# Patient Record
Sex: Male | Born: 1975 | Race: Black or African American | Hispanic: No | Marital: Single | Smoking: Former smoker
Health system: Southern US, Community
[De-identification: ages and names within clinical notes are randomized; demographics above are authoritative.]

## PROBLEM LIST (undated history)

## (undated) DIAGNOSIS — I10 Essential (primary) hypertension: Secondary | ICD-10-CM

## (undated) DIAGNOSIS — I509 Heart failure, unspecified: Secondary | ICD-10-CM

## (undated) HISTORY — DX: Essential (primary) hypertension: I10

## (undated) HISTORY — PX: NO PAST SURGERIES: SHX2092

---

## 2005-01-07 ENCOUNTER — Emergency Department: Payer: Self-pay | Admitting: Unknown Physician Specialty

## 2005-01-07 ENCOUNTER — Emergency Department: Payer: Self-pay | Admitting: Emergency Medicine
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
## 2005-10-02 ENCOUNTER — Emergency Department (HOSPITAL_COMMUNITY): Admission: EM | Admit: 2005-10-02 | Discharge: 2005-10-03 | Payer: Self-pay | Admitting: Emergency Medicine

## 2005-10-02 IMAGING — CR DG CHEST 2V
2 series · 2 of 2 positions shown · non-contrast
Comparison: None.

CLINICAL DATA: Cough, chest pain.
 CHEST - 2 VIEW:
 PA and lateral chest radiograph - [DATE].

[w chest pa]
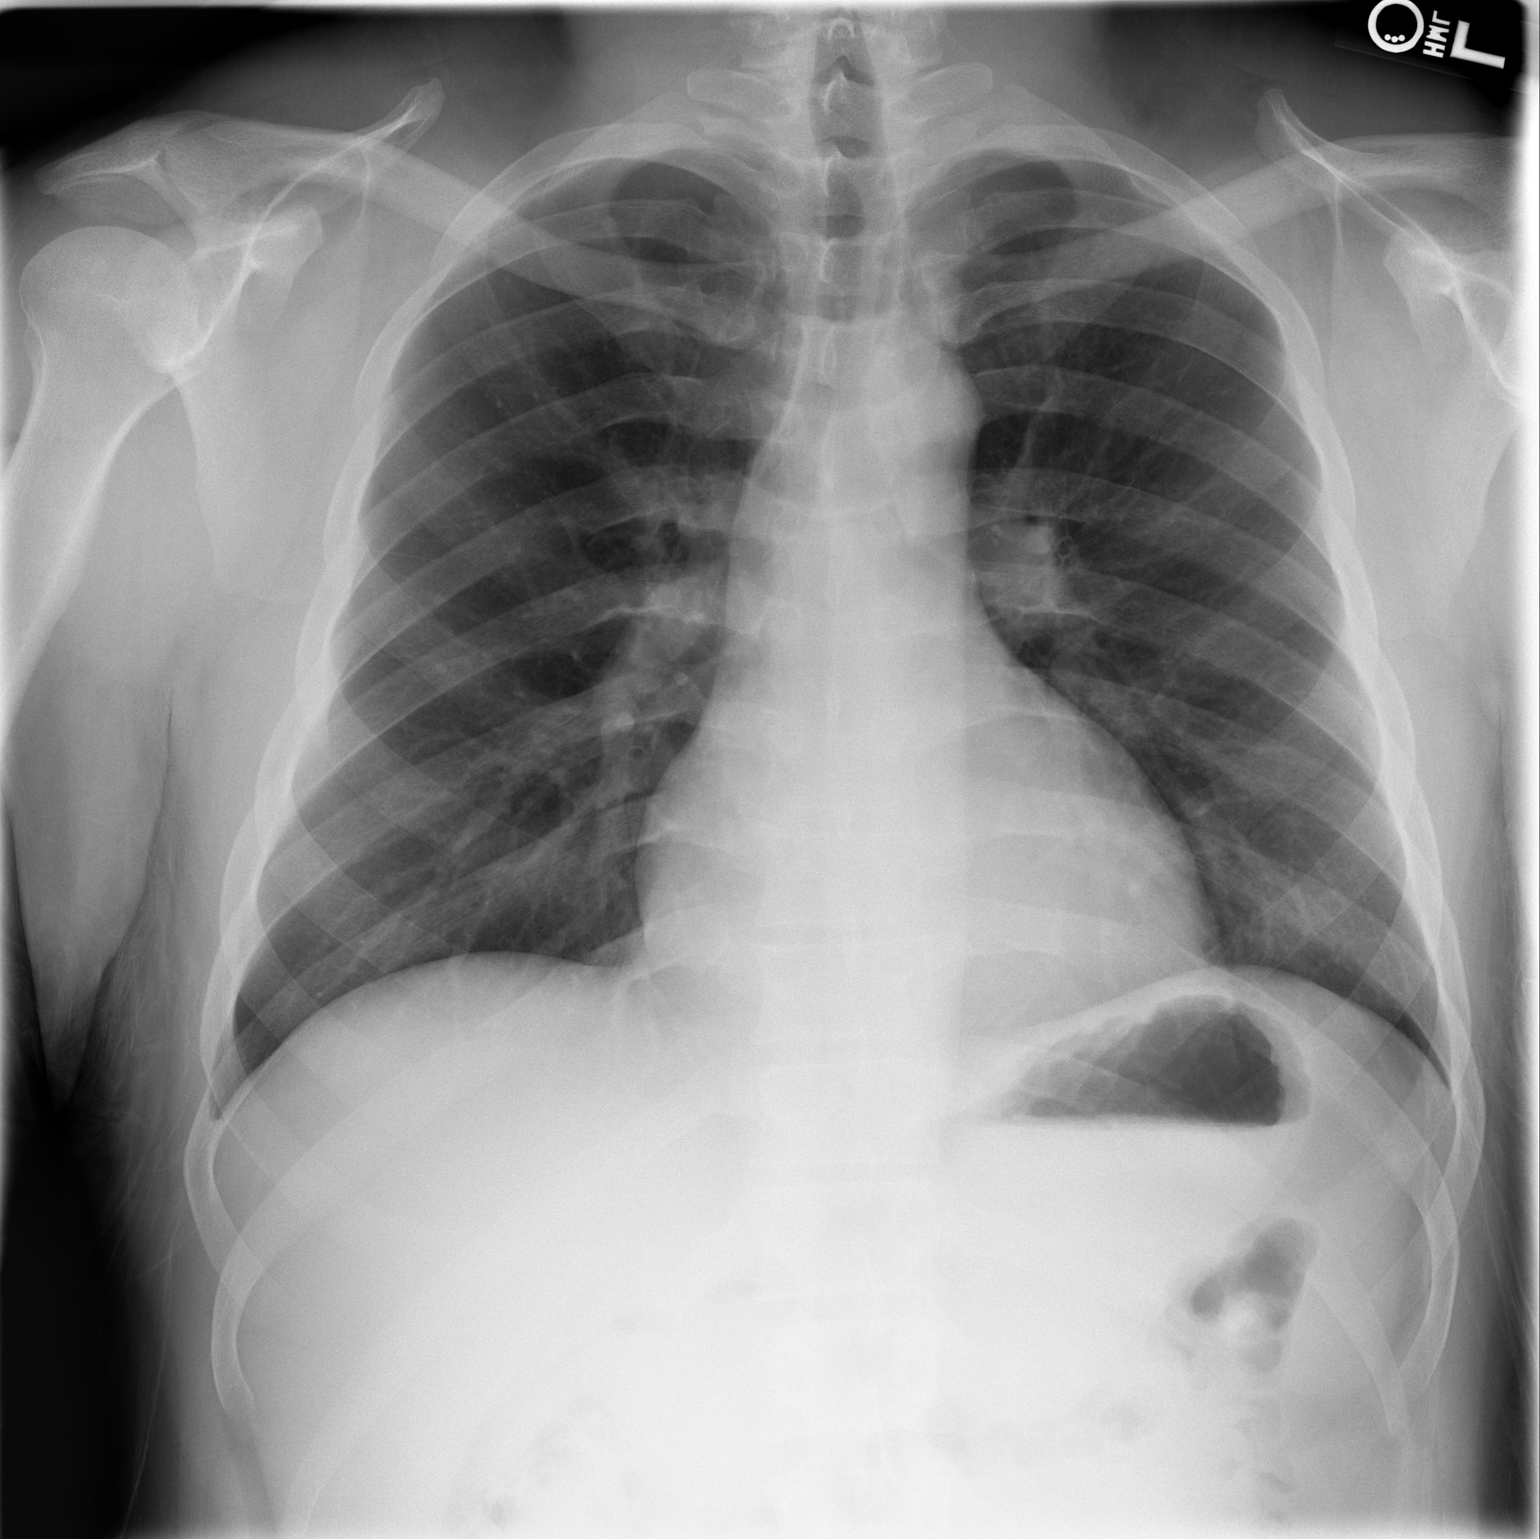

[w chest lat]
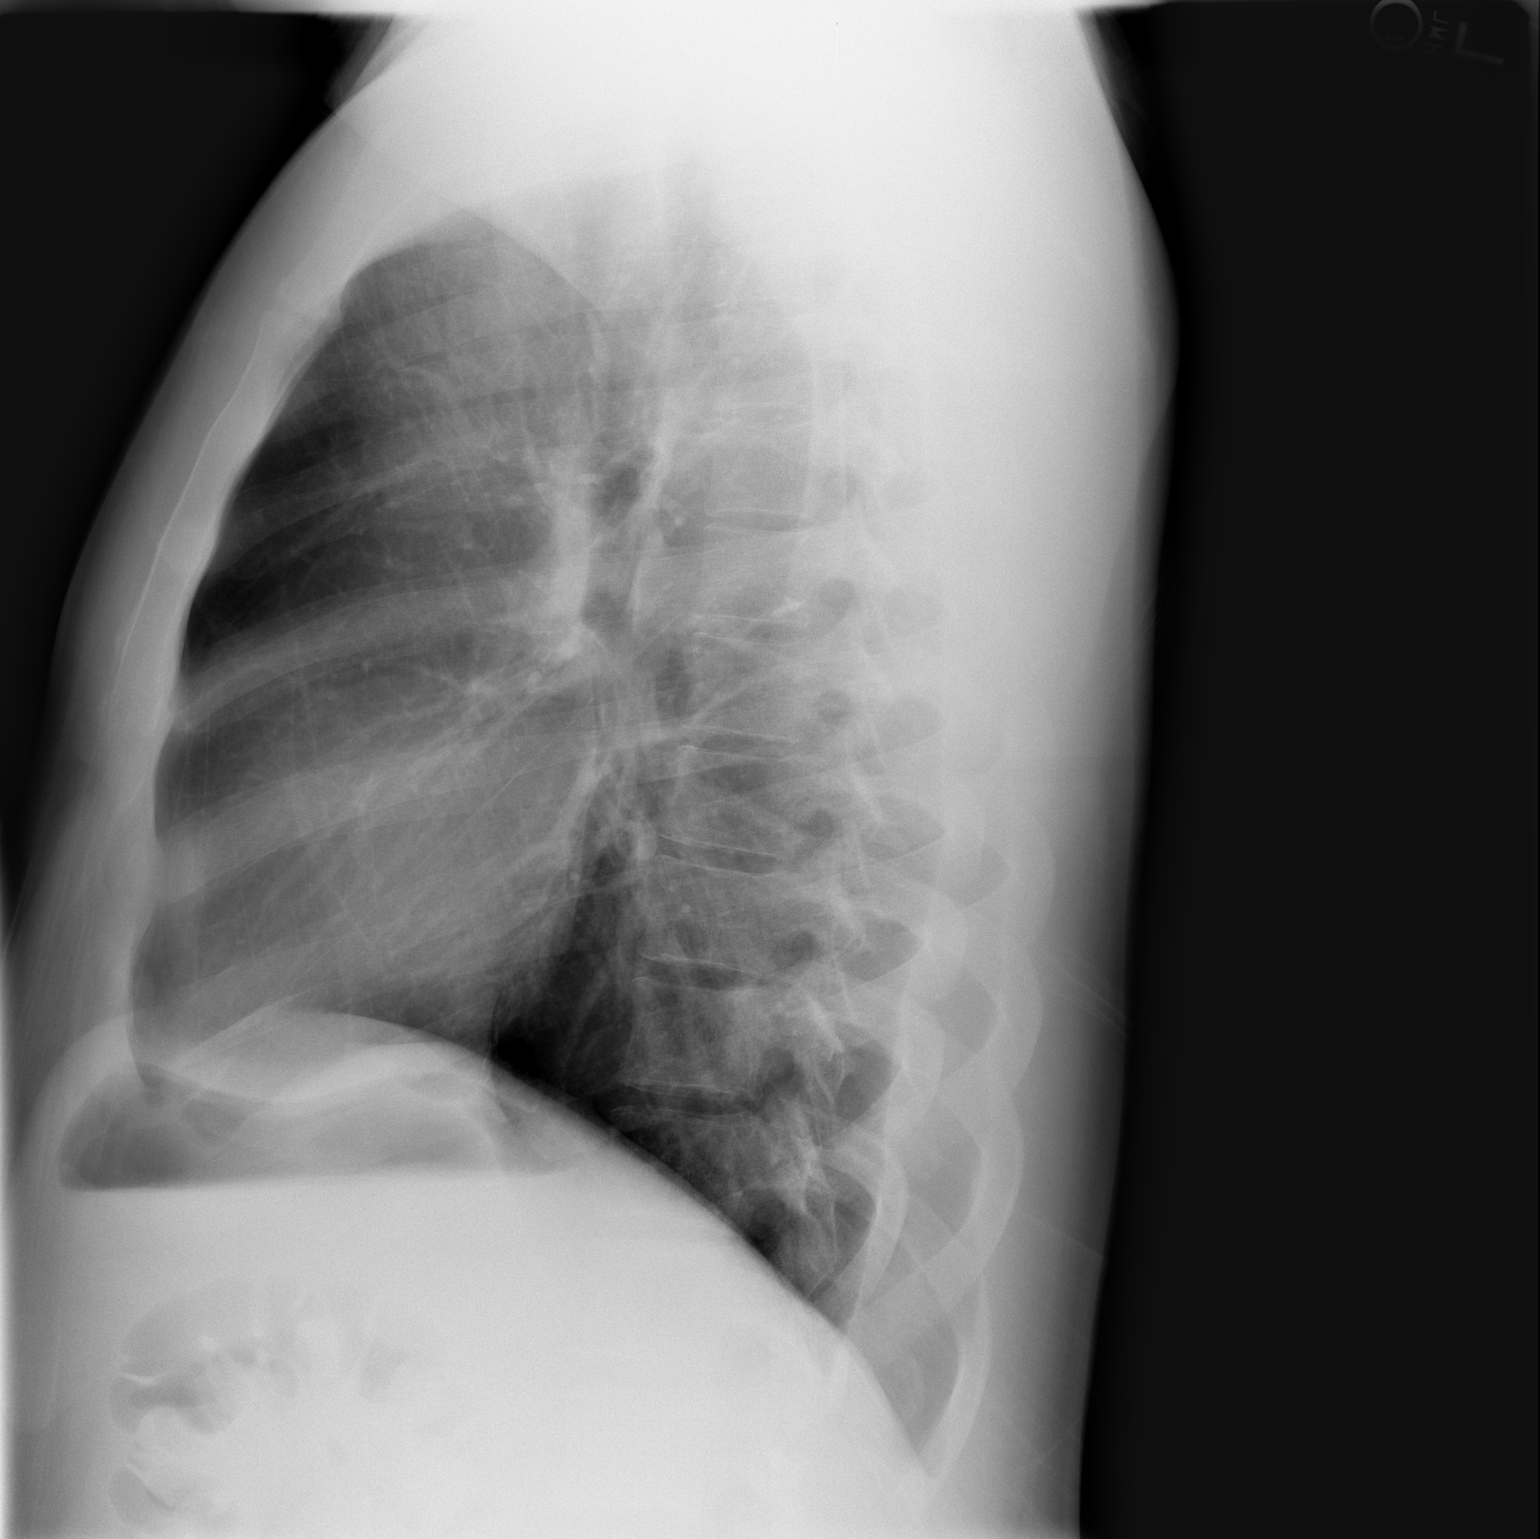

[2 of 2 positions shown; findings below may reference images not displayed]

FINDINGS: The heart size and mediastinal contours are within normal limits.  Both lungs are clear.  The visualized skeletal structures are within normal limits.
IMPRESSION: No active cardiopulmonary disease.

## 2006-02-09 ENCOUNTER — Other Ambulatory Visit: Payer: Self-pay

## 2006-02-09 ENCOUNTER — Emergency Department: Payer: Self-pay | Admitting: Internal Medicine

## 2006-02-09 IMAGING — CR DG CHEST 1V PORT
1 series · 1 of 1 positions shown · non-contrast
Comparison: none

REASON FOR EXAM: Chest pain
COMMENTS:

PROCEDURE:     DXR - DXR PORTABLE CHEST SINGLE VIEW  - [DATE] [DATE]
RESULT:     The lungs are adequately inflated. There is no focal infiltrate.
The heart is not enlarged. The pulmonary vascularity is not engorged.

[view not recorded]
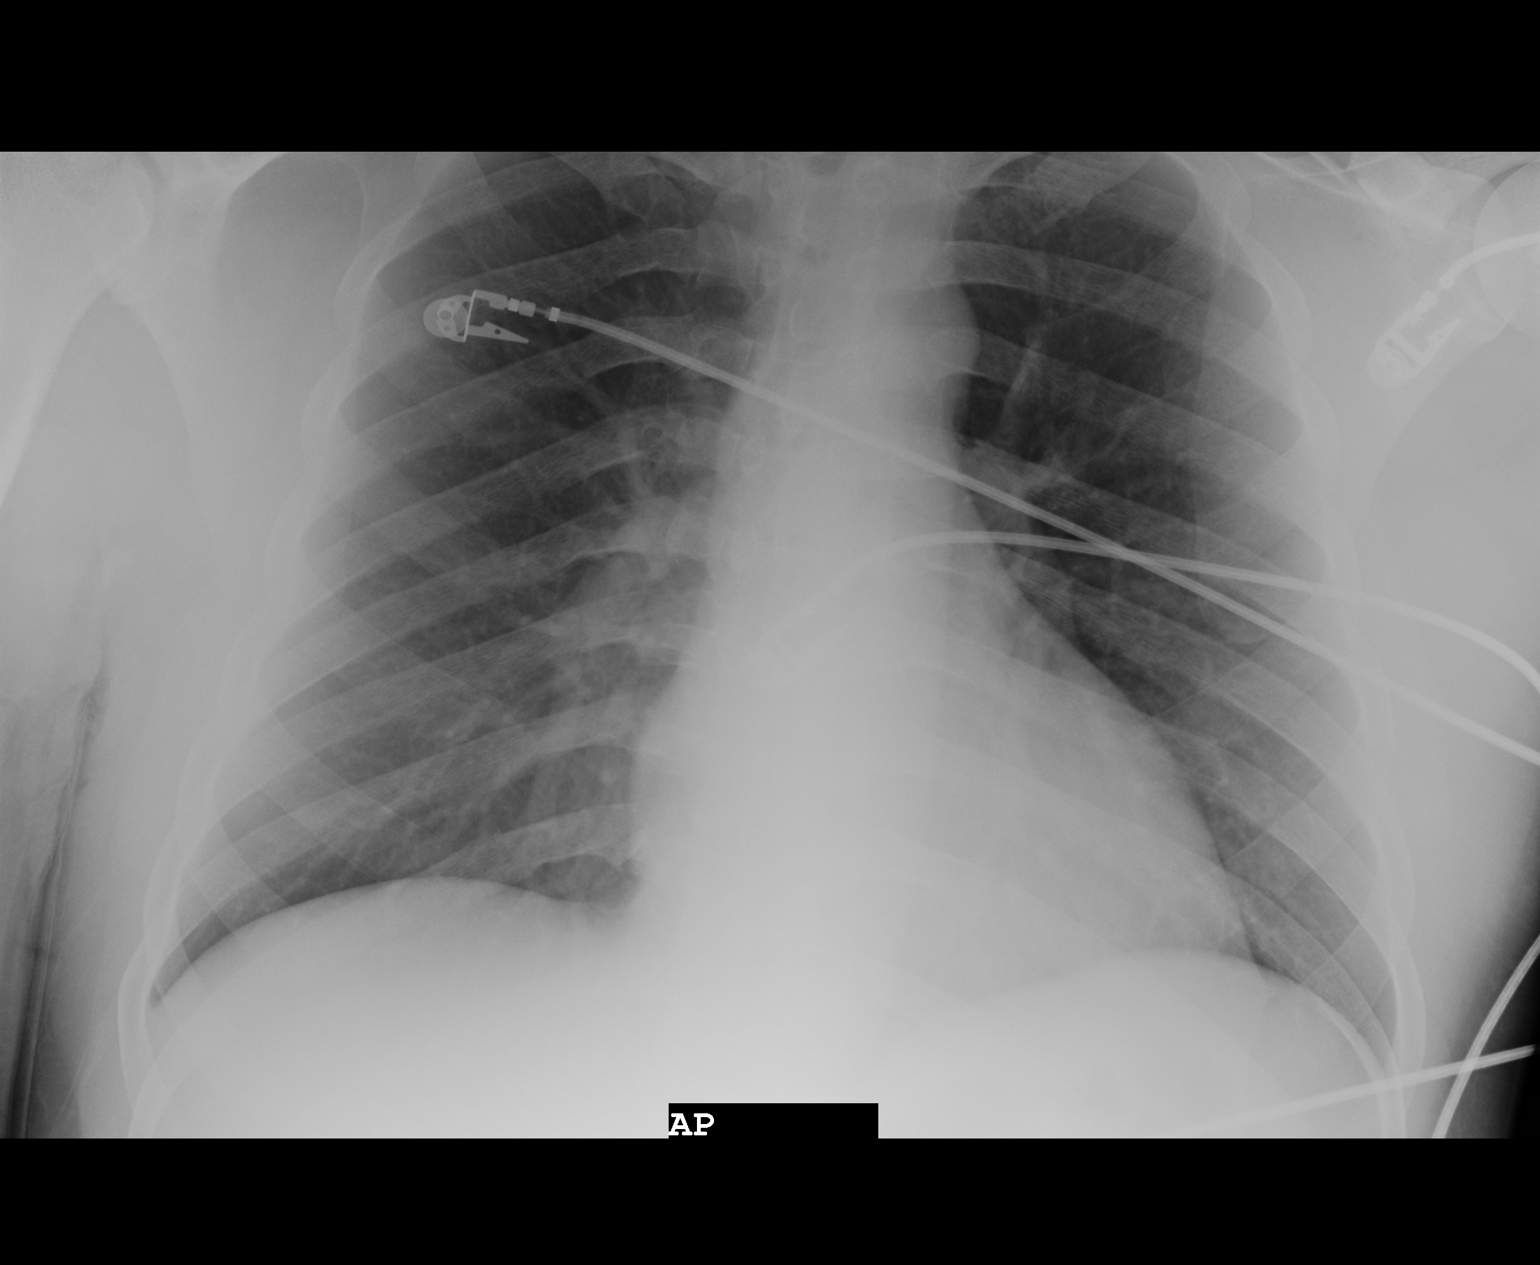

[1 of 1 positions shown; findings below may reference images not displayed]

IMPRESSION: I do not see evidence of acute cardiopulmonary disease.

## 2006-02-11 ENCOUNTER — Ambulatory Visit: Payer: Self-pay | Admitting: Internal Medicine

## 2007-04-08 ENCOUNTER — Emergency Department: Payer: Self-pay | Admitting: Emergency Medicine
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
## 2015-08-18 ENCOUNTER — Encounter: Payer: Self-pay | Admitting: Gynecology

## 2015-08-18 ENCOUNTER — Ambulatory Visit
Admission: EM | Admit: 2015-08-18 | Discharge: 2015-08-18 | Disposition: A | Discharge status: Home / Self Care | Payer: Self-pay | Attending: Family Medicine | Admitting: Family Medicine

## 2015-08-18 DIAGNOSIS — R109 Unspecified abdominal pain: Secondary | ICD-10-CM

## 2015-08-18 DIAGNOSIS — R8290 Unspecified abnormal findings in urine: Secondary | ICD-10-CM

## 2015-08-18 DIAGNOSIS — N400 Enlarged prostate without lower urinary tract symptoms: Secondary | ICD-10-CM

## 2015-08-18 HISTORY — DX: Heart failure, unspecified: I50.9

## 2015-08-18 LAB — URINALYSIS COMPLETE WITH MICROSCOPIC (ARMC ONLY)
BACTERIA UA: NONE SEEN
BILIRUBIN URINE: NEGATIVE
Glucose, UA: NEGATIVE mg/dL
Hgb urine dipstick: NEGATIVE
KETONES UR: NEGATIVE mg/dL
LEUKOCYTES UA: NEGATIVE
Nitrite: NEGATIVE
PH: 6 (ref 5.0–8.0)
PROTEIN: NEGATIVE mg/dL
SQUAMOUS EPITHELIAL / LPF: NONE SEEN
Specific Gravity, Urine: 1.01 (ref 1.005–1.030)
WBC, UA: NONE SEEN WBC/hpf (ref 0–5)

## 2015-08-18 MED ORDER — ORPHENADRINE CITRATE ER 100 MG PO TB12: 100.0000 mg | ORAL_TABLET | Freq: Two times a day (BID) | ORAL | Status: DC

## 2015-08-18 MED ORDER — MELOXICAM 15 MG PO TABS: 15.0000 mg | ORAL_TABLET | Freq: Every day | ORAL | Status: DC

## 2015-08-18 NOTE — ED Provider Notes (Signed)
CSN: [id number]     Arrival date & time 08/18/15  1433 History   First MD Initiated Contact with Patient 08/18/15 1514    Nurses notes were reviewed. Chief Complaint  Patient presents with  . Flank Pain    Patient is here because of right flank pain. He states for about a week he's had increasing right flank pain initially just when he lays down but now even when he is up doing activities he is starting to have pain on the right side. He denies any shortness of breath but does report having a malodorous urine for several weeks now. He's tried taking increased fluids but still has noticed his urine to be very malodorous. He denies having any history of UTIs in the past he's had an STD before many years ago and this does not remind him of the STD he denies any discharge last time he was sexually active about 46 weeks ago he reports no problem. He does not have a history of UTI either.  (Consider location/radiation/quality/duration/timing/severity/associated sxs/prior Treatment) Patient is a 39 y.o. male presenting with flank pain. The history is provided by the patient.  Flank Pain This is a new problem. The current episode started more than 1 week ago (Around a week he's been having flank pain). The problem occurs constantly. The problem has been gradually worsening. Pertinent negatives include no chest pain, no abdominal pain, no headaches and no shortness of breath. The symptoms are aggravated by exertion. Relieved by: Initially some improvement with position his legs at night but now nothing. He has tried nothing for the symptoms. The treatment provided mild relief.    Past Medical History  Diagnosis Date  . CHF (congestive heart failure) (HCC)    History reviewed. No pertinent past surgical history. No family history on file. Social History  Substance Use Topics  . Smoking status: Never Smoker   . Smokeless tobacco: None  . Alcohol Use: No    Review of Systems  Respiratory: Negative  for shortness of breath.   Cardiovascular: Negative for chest pain.  Gastrointestinal: Negative for abdominal pain.  Genitourinary: Positive for flank pain.  Neurological: Negative for headaches.  All other systems reviewed and are negative.   Allergies  Review of patient's allergies indicates no known allergies.  Home Medications   Prior to Admission medications   Medication Sig Start Date End Date Taking? Authorizing Provider  meloxicam (MOBIC) 15 MG tablet Take 1 tablet (15 mg total) by mouth daily. 08/18/15   Hassan Rowan, MD  orphenadrine (NORFLEX) 100 MG tablet Take 1 tablet (100 mg total) by mouth 2 (two) times daily. 08/18/15   Hassan Rowan, MD   Meds Ordered and Administered this Visit  Medications - No data to display  BP 133/82 mmHg  Pulse 70  Temp(Src) 97.8 F (36.6 C) (Oral)  Resp 18  Ht  (1.854 m)  Wt 275 lb (124.739 kg)  BMI 36.29 kg/m2  SpO2 99% No data found.   Physical Exam  Constitutional: He is oriented to person, place, and time. He appears well-developed and well-nourished.  HENT:  Head: Normocephalic and atraumatic.  Eyes: Conjunctivae are normal. Pupils are equal, round, and reactive to light.  Pulmonary/Chest: Effort normal.  Abdominal: Soft. Bowel sounds are normal. He exhibits no distension.  Genitourinary: Rectum normal, testes normal and penis normal. Rectal exam shows no tenderness. Prostate is enlarged. Prostate is not tender. Right testis shows no mass, no swelling and no tenderness. Left testis shows no  mass, no swelling and no tenderness. Circumcised. No penile tenderness.  Musculoskeletal: Normal range of motion.  Neurological: He is alert and oriented to person, place, and time. A cranial nerve deficit is present.  Skin: Skin is warm. No erythema.  Psychiatric: He has a normal mood and affect.  Vitals reviewed.   ED Course  Procedures (including critical care time)  Labs Review Labs Reviewed  URINE CULTURE  URINALYSIS  COMPLETEWITH MICROSCOPIC ([facility] ONLY)    Imaging Review No results found.   Visual Acuity Review  Right Eye Distance:   Left Eye Distance:   Bilateral Distance:    Right Eye Near:   Left Eye Near:    Bilateral Near:     Results for orders placed or performed during the hospital encounter of 08/18/15  Urinalysis complete, with microscopic  Result Value Ref Range   Color, Urine YELLOW YELLOW   APPearance CLEAR CLEAR   Glucose, UA NEGATIVE NEGATIVE mg/dL   Bilirubin Urine NEGATIVE NEGATIVE   Ketones, ur NEGATIVE NEGATIVE mg/dL   Specific Gravity, Urine 1.010 1.005 - 1.030   Hgb urine dipstick NEGATIVE NEGATIVE   pH 6.0 5.0 - 8.0   Protein, ur NEGATIVE NEGATIVE mg/dL   Nitrite NEGATIVE NEGATIVE   Leukocytes, UA NEGATIVE NEGATIVE   RBC / HPF 0-5 0 - 5 RBC/hpf   WBC, UA NONE SEEN 0 - 5 WBC/hpf   Bacteria, UA NONE SEEN NONE SEEN   Squamous Epithelial / LPF NONE SEEN NONE SEEN      MDM   1. Flank pain   2. Urine malodor   3. Prostatic hypertrophy, benign      At this time w/a negative prostate exam and normal urine will place him on  muscle relaxant Norflex twice a day, and anti-inflammatory Mobic 15 mg daily. Explained to him that if the urine is positive we will need to treat him for UTI and I did recommend that he follow-up with PCP of his choice. Kilos currently being followed by 1. He does not have insurance clinic. He has a history of CHF but [facility]. He's had no history of hypertension blood pressure stable today and this is another reason why he needs to establish himself with a PCP        Hassan RowanEugene Cadence Haslam, MD 08/18/15 [phone]

## 2015-08-18 NOTE — Discharge Instructions (Signed)
Flank Pain Flank pain is pain in your side. The flank is the area of your side between your upper belly (abdomen) and your back. Pain in this area can be caused by many different things. HOME CARE Home care and treatment will depend on the cause of your pain.  Rest as told by your doctor.  Drink enough fluids to keep your pee (urine) clear or pale yellow.  Only take medicine as told by your doctor.  Tell your doctor about any changes in your pain.  Follow up with your doctor. GET HELP RIGHT AWAY IF:   Your pain does not get better with medicine.   You have new symptoms or your symptoms get worse.  Your pain gets worse.   You have belly (abdominal) pain.   You are short of breath.   You always feel sick to your stomach (nauseous).   You keep throwing up (vomiting).   You have puffiness (swelling) in your belly.   You feel light-headed or you pass out (faint).   You have blood in your pee.  You have a fever or lasting symptoms for more than 2-3 days.  You have a fever and your symptoms suddenly get worse. MAKE SURE YOU:   Understand these instructions.  Will watch your condition.  Will get help right away if you are not doing well or get worse.   This information is not intended to replace advice given to you by your health care provider. Make sure you discuss any questions you have with your health care provider.   Document Released: 06/02/2008 Document Revised: 09/14/2014 Document Reviewed: 04/07/2012 Elsevier Interactive Patient Education 2016 [employer]  Benign Prostatic Hypertrophy The prostate gland is part of the reproductive system of men. A normal prostate is about the size and shape of a walnut. The prostate gland produces a fluid that is mixed with sperm to make semen. This gland surrounds the urethra and is located in front of the rectum and just below the bladder. The bladder is where urine is stored. The urethra is the tube through which  urine passes from the bladder to get out of the body. The prostate grows as a man ages. An enlarged prostate not caused by cancer is called benign prostatic hypertrophy (BPH). An enlarged prostate can press on the urethra. This can make it harder to pass urine. In the early stages of enlargement, the bladder can get by with a narrowed urethra by forcing the urine through. If the problem gets worse, medical or surgical treatment may be required.  This condition should be followed by your health care provider. The accumulation of urine in the bladder can cause infection. Back pressure and infection can progress to bladder damage and kidney (renal) failure. If needed, your health care provider may refer you to a specialist in kidney and prostate disease (urologist). CAUSES  BPH is a common health problem in men older than 50 years. This condition is a normal part of aging. However, not all men will develop problems from this condition. If the enlargement grows away from the urethra, then there will not be any compression of the urethra and resistance to urine flow.If the growth is toward the urethra and compresses it, you will experience difficulty urinating.  SYMPTOMS   Not able to completely empty your bladder.  Getting up often during the night to urinate.  Need to urinate frequently during the day.  Difficultly starting urine flow.  Decrease in size and strength of your  urine stream.  Dribbling after urination.  Pain on urination (more common with infection).  Inability to pass urine. This needs immediate treatment.  The development of a urinary tract infection. DIAGNOSIS  These tests will help your health care provider understand your problem:  A thorough history and physical examination.  A urination history, with the number of times you urinate, the amounts of urine, the strength of the urine stream, and the feeling of emptiness or fullness after urinating.  A postvoid bladder  scan that measures any amount of urine that may remain in your bladder after you finish urinating.  Digital rectal exam. In a rectal exam, your health care provider checks your prostate by putting a gloved, lubricated finger into your rectum to feel the back of your prostate gland. This exam detects the size of your gland and abnormal lumps or growths.  Exam of your urine (urinalysis).  Prostate specific antigen (PSA) screening. This is a blood test used to screen for prostate cancer.  Rectal ultrasonography. This test uses sound waves to electronically produce a picture of your prostate gland. TREATMENT  Once symptoms begin, your health care provider will monitor your condition. Of the men with this condition, one third will have symptoms that stabilize, one third will have symptoms that improve, and one third will have symptoms that progress in the first year. Mild symptoms may not need treatment. Simple observation and yearly exams may be all that is required. Medicines and surgery are options for more severe problems. Your health care provider can help you make an informed decision for what is best. Two classes of medicines are available for relief of prostate symptoms:  Medicines that shrink the prostate. This helps relieve symptoms. These medicines take time to work, and it may be months before any improvement is seen.  Uncommon side effects include problems with sexual function.  Medicines to relax the muscle of the prostate. This also relieves the obstruction by reducing any compression on the urethra.This group of medicines work much faster than those that reduce the size of the prostate gland. Usually, one can experience improvement in days to weeks..  Side effects can include dizziness, fatigue, lightheadedness, and retrograde ejaculation (diminished volume of ejaculate). Several types of surgical treatments are available for relief of prostate symptoms:  Transurethral resection of  the prostate (TURP)--In this treatment, an instrument is inserted through opening at the tip of the penis. It is used to cut away pieces of the inner core of the prostate. The pieces are removed through the same opening of the penis. This removes the obstruction and helps get rid of the symptoms.  Transurethral incision (TUIP)--In this procedure, small cuts are made in the prostate. This lessens the prostates pressure on the urethra.  Transurethral microwave thermotherapy (TUMT)--This procedure uses microwaves to create heat. The heat destroys and removes a small amount of prostate tissue.  Transurethral needle ablation (TUNA)--This is a procedure that uses radio frequencies to do the same as TUMT.  Interstitial laser coagulation (ILC)--This is a procedure that uses a laser to do the same as TUMT and TUNA.  Transurethral electrovaporization (TUVP)--This is a procedure that uses electrodes to do the same as the procedures listed above. SEEK MEDICAL CARE IF:   You develop a fever.  There is unexplained back pain.  Symptoms are not helped by medicines prescribed.  You develop side effects from the medicine you are taking.  Your urine becomes very dark or has a bad smell.  Your lower  abdomen becomes distended and you have difficulty passing your urine. SEEK IMMEDIATE MEDICAL CARE IF:   You are suddenly unable to urinate. This is an emergency. You should be seen immediately.  There are large amounts of blood or clots in the urine.  Your urinary problems become unmanageable.  You develop lightheadedness, severe dizziness, or you feel faint.  You develop moderate to severe low back or flank pain.  You develop chills or fever.   This information is not intended to replace advice given to you by your health care provider. Make sure you discuss any questions you have with your health care provider.   Document Released: 08/24/2005 Document Revised: 08/29/2013 Document Reviewed:  03/09/2013 Elsevier Interactive Patient Education [employer]

## 2015-08-18 NOTE — ED Notes (Signed)
Patient c\o pain started in back and rotate to ride side x 1 week. Per patient odor to his urine.

## 2015-08-20 LAB — URINE CULTURE: Culture: NO GROWTH

## 2015-11-25 ENCOUNTER — Encounter: Payer: Self-pay | Admitting: *Deleted

## 2015-11-25 ENCOUNTER — Ambulatory Visit
Admission: EM | Admit: 2015-11-25 | Discharge: 2015-11-25 | Disposition: A | Discharge status: Home / Self Care | Payer: Self-pay | Attending: Emergency Medicine | Admitting: Emergency Medicine

## 2015-11-25 DIAGNOSIS — J111 Influenza due to unidentified influenza virus with other respiratory manifestations: Secondary | ICD-10-CM

## 2015-11-25 LAB — RAPID INFLUENZA A&B ANTIGENS (ARMC ONLY)
INFLUENZA A (ARMC): NEGATIVE
INFLUENZA B (ARMC): NEGATIVE

## 2015-11-25 MED ORDER — FLUTICASONE PROPIONATE 50 MCG/ACT NA SUSP: 2.0000 | Freq: Every day | NASAL | Status: DC

## 2015-11-25 MED ORDER — OSELTAMIVIR PHOSPHATE 75 MG PO CAPS: 75.0000 mg | ORAL_CAPSULE | Freq: Two times a day (BID) | ORAL | Status: DC

## 2015-11-25 MED ORDER — GUAIFENESIN-CODEINE 100-10 MG/5ML PO SYRP: 10.0000 mL | ORAL_SOLUTION | Freq: Four times a day (QID) | ORAL | Status: DC | PRN

## 2015-11-25 MED ORDER — IBUPROFEN 800 MG PO TABS: 800.0000 mg | ORAL_TABLET | Freq: Three times a day (TID) | ORAL | Status: DC

## 2015-11-25 NOTE — Discharge Instructions (Signed)
Drink plenty of extra fluids. Take 1 g of Tylenol with 800 mg of ibuprofen 3 times a day for body aches and fever. Start some saline nasal irrigation in addition to the rest, medications. Follow up with your doctor in several days. Go to the ER if you get worse.

## 2015-11-25 NOTE — ED Notes (Signed)
Patient states he has had fever and body aches for the past 2 days.
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
## 2017-08-26 ENCOUNTER — Other Ambulatory Visit: Payer: Self-pay

## 2017-08-26 ENCOUNTER — Ambulatory Visit
Admission: EM | Admit: 2017-08-26 | Discharge: 2017-08-26 | Disposition: A | Discharge status: Home / Self Care | Payer: Self-pay | Attending: Family Medicine | Admitting: Family Medicine

## 2017-08-26 DIAGNOSIS — R509 Fever, unspecified: Secondary | ICD-10-CM

## 2017-08-26 DIAGNOSIS — R69 Illness, unspecified: Secondary | ICD-10-CM

## 2017-08-26 DIAGNOSIS — J029 Acute pharyngitis, unspecified: Secondary | ICD-10-CM

## 2017-08-26 DIAGNOSIS — M791 Myalgia, unspecified site: Secondary | ICD-10-CM

## 2017-08-26 DIAGNOSIS — J111 Influenza due to unidentified influenza virus with other respiratory manifestations: Secondary | ICD-10-CM

## 2017-08-26 DIAGNOSIS — H6692 Otitis media, unspecified, left ear: Secondary | ICD-10-CM

## 2017-08-26 LAB — RAPID STREP SCREEN (MED CTR MEBANE ONLY): STREPTOCOCCUS, GROUP A SCREEN (DIRECT): NEGATIVE

## 2017-08-26 MED ORDER — AMOXICILLIN 875 MG PO TABS: 875.0000 mg | ORAL_TABLET | Freq: Two times a day (BID) | ORAL | 0 refills | Status: DC

## 2017-08-26 MED ORDER — BENZONATATE 200 MG PO CAPS: 200.0000 mg | ORAL_CAPSULE | Freq: Three times a day (TID) | ORAL | 0 refills | Status: DC | PRN

## 2017-08-26 NOTE — Discharge Instructions (Signed)
Take medication as prescribed. Rest. Drink plenty of fluids.  ° °Follow up with your primary care physician this week as needed. Return to Urgent care for new or worsening concerns.  ° °

## 2017-08-26 NOTE — ED Provider Notes (Addendum)
[facility] ____________________________________________  Time seen: Approximately 1:30 PM  I have reviewed the triage vital signs and the nursing notes.   HISTORY  Chief Complaint Fever   HPI Edward Lozano is a 41 y.o. male present for evaluation of 4 days of overall not feeling well.  Patient reports this past Sunday he felt like he was starting to get sick and have a sore throat and some body aches.  Reports sore throat has continued and he has had onset of cough and some nasal congestion.  Reports some left ear pain and discomfort described as mild.  States sore throat is mild to moderate.  States some greenish nasal drainage.  States continues to eat and drink overall well with overall good appetite.  States that he has had intermittent fevers with fever max of 102.5.  States that he did also have 100.8 fever this morning and he took Tylenol.  States has been taking Tylenol Sinus and Tylenol intermittently.  States initial onset he was having significant body aches and chills with quick onset of symptoms.  Denies known sick contacts.  Has continue to remain active.  States cough is primarily at night and first thing in the morning, occasionally productive with some greenish mucus as well as occasional greenish mucus from blowing nose.  Denies other aggravating or alleviating factors.  Reports otherwise feels well. Denies chest pain, shortness of breath, hemoptysis, extremity pain, extremity swelling or rash. Denies recent sickness. Denies recent antibiotic use. Patient states he has a history of congestive heart failure that was diagnosed many years ago, states he is not on any medications for this.  Denies history of asthma, COPD, emphysema.    Past Medical History:  Diagnosis Date  . CHF (congestive heart failure) (HCC)     There are no active problems to display for this patient.   History reviewed. No pertinent surgical history.   No current facility-administered  medications for this encounter.   Current Outpatient Medications:  .  amoxicillin (AMOXIL) 875 MG tablet, Take 1 tablet (875 mg total) by mouth 2 (two) times daily., Disp: 20 tablet, Rfl: 0 .  benzonatate (TESSALON) 200 MG capsule, Take 1 capsule (200 mg total) by mouth 3 (three) times daily as needed for cough., Disp: 20 capsule, Rfl: 0 .  fluticasone (FLONASE) 50 MCG/ACT nasal spray, Place 2 sprays into both nostrils daily., Disp: 16 g, Rfl: 0  Allergies Patient has no known allergies.  Family History  Problem Relation Age of Onset  . Heart failure Mother     Social History Social History   Tobacco Use  . Smoking status: Never Smoker  . Smokeless tobacco: Never Used  Substance Use Topics  . Alcohol use: No  . Drug use: No    Review of Systems Constitutional: No fever/chills Eyes: No visual changes. ENT: As above.  Cardiovascular: Denies chest pain. Respiratory: Denies shortness of breath. Gastrointestinal: States yesterday he had some abdominal pain during body aches only, none since.  Denies current abdominal pain.  No nausea, no vomiting.  No diarrhea.   Genitourinary: Negative for dysuria. Musculoskeletal: Negative for back pain. Skin: Negative for rash. .  ____________________________________________   PHYSICAL EXAM:  VITAL SIGNS: ED Triage Vitals  Enc Vitals Group     BP 08/26/17 1306 (!) 135/94     Pulse Rate 08/26/17 1306 89     Resp 08/26/17 1306 18     Temp 08/26/17 1306 98.2 F (36.8 C)     Temp  Source 08/26/17 1306 Oral     SpO2 08/26/17 1306 100 %     Weight 08/26/17 1304 263 lb (119.3 kg)     Height 08/26/17 1304 6\' 1"  (1.854 m)     Head Circumference --      Peak Flow --      Pain Score 08/26/17 1304 6     Pain Loc --      Pain Edu? --      Excl. in GC? --    Constitutional: Alert and oriented. Well appearing and in no acute distress. Eyes: Conjunctivae are normal.  Head: Atraumatic. No sinus tenderness to palpation. No swelling. No  erythema.  Ears: Left: Nontender, normal canal, moderate erythema and dull TM.  Right: Nontender, normal canal, no erythema and normal TM.  No mastoid tenderness bilaterally.  Nose:Nasal congestion   Mouth/Throat: Mucous membranes are moist. Mild pharyngeal erythema. No tonsillar swelling or exudate.  Neck: No stridor.  No cervical spine tenderness to palpation. Hematological/Lymphatic/Immunilogical: No cervical lymphadenopathy. Cardiovascular: Normal rate, regular rhythm. Grossly normal heart sounds.  Good peripheral circulation. Respiratory: Normal respiratory effort.  No retractions. No wheezes, rales or rhonchi. Good air movement.  Dry intermittent cough noted in room. Gastrointestinal: Soft and nontender.No CVA tenderness. Musculoskeletal: Ambulatory with steady gait. No cervical, thoracic or lumbar tenderness to palpation. Neurologic:  Normal speech and language. No gait instability. Skin:  Skin appears warm, dry and intact. No rash noted. Psychiatric: Mood and affect are normal. Speech and behavior are normal.  ___________________________________________   LABS (all labs ordered are listed, but only abnormal results are displayed)  Labs Reviewed  RAPID STREP SCREEN (NOT [facility])  CULTURE, GROUP A STREP [facility])     PROCEDURES Procedures    INITIAL IMPRESSION / ASSESSMENT AND PLAN / ED COURSE  Pertinent labs & imaging results that were available during my care of the patient were reviewed by me and considered in my medical decision making (see chart for details).  Well-appearing patient.  No acute distress.  Suspect recent influenza-like illness.  Discussed due to timeframe, not recommended Tamiflu.  Encouraged supportive care, rest, fluids.  Left otitis media noted and will treat with oral amoxicillin.  Also as needed [employer].  Discussed strict follow-up and return parameters.Discussed indication, risks and benefits of medications with patient.  Discussed follow up  with Primary care physician this week as needed. Discussed follow up and return parameters including no resolution or any worsening concerns. Patient verbalized understanding and agreed to plan.   ____________________________________________   FINAL CLINICAL IMPRESSION(S) / ED DIAGNOSES  Final diagnoses:  Influenza-like illness  Left otitis media, unspecified otitis media type     ED Discharge Orders        Ordered    amoxicillin (AMOXIL) 875 MG tablet  2 times daily     08/26/17 1339    benzonatate (TESSALON) 200 MG capsule  3 times daily PRN     08/26/17 1339       Note: This dictation was prepared with Dragon dictation along with smaller phrase technology. Any transcriptional errors that result from this process are unintentional.           Renford DillsMiller, Haleema Vanderheyden, NP 08/26/17 1428

## 2017-08-26 NOTE — ED Triage Notes (Addendum)
Pt with sore throat, fever x past couple days. Fever this a.m. 100.8.  Cough productive of green phlegm worse at night. Pain 6/10

## 2017-08-28 LAB — CULTURE, GROUP A STREP (THRC)

## 2017-09-06 ENCOUNTER — Telehealth: Payer: Self-pay | Admitting: Emergency Medicine

## 2017-09-06 NOTE — Telephone Encounter (Signed)
Patient called stating that he received a letter re: lab results. Advised patient of lab results and to follow up if not feeling better. Patient agreed and vu.

## 2017-09-07 ENCOUNTER — Other Ambulatory Visit: Payer: Self-pay

## 2017-09-07 ENCOUNTER — Ambulatory Visit
Admission: EM | Admit: 2017-09-07 | Discharge: 2017-09-07 | Disposition: A | Discharge status: Home / Self Care | Payer: Self-pay | Attending: Family Medicine | Admitting: Family Medicine

## 2017-09-07 DIAGNOSIS — J209 Acute bronchitis, unspecified: Secondary | ICD-10-CM

## 2017-09-07 DIAGNOSIS — R05 Cough: Secondary | ICD-10-CM

## 2017-09-07 DIAGNOSIS — R059 Cough, unspecified: Secondary | ICD-10-CM

## 2017-09-07 DIAGNOSIS — R1013 Epigastric pain: Secondary | ICD-10-CM

## 2017-09-07 MED ORDER — FLUCONAZOLE 150 MG PO TABS: 150.0000 mg | ORAL_TABLET | Freq: Every day | ORAL | 0 refills | Status: DC

## 2017-09-07 MED ORDER — AZITHROMYCIN 250 MG PO TABS: ORAL_TABLET | ORAL | 0 refills | Status: DC

## 2017-09-07 NOTE — ED Provider Notes (Signed)
[facility]    CSN: [id number] Arrival date & time: 09/07/17  1014     History   Chief Complaint Chief Complaint  Patient presents with  . Sore Throat    HPI Edward Lozano is a 42 y.o. male.   The history is provided by the patient.  Sore Throat  Pertinent negatives include no headaches.  URI  Presenting symptoms: congestion, cough and rhinorrhea   Severity:  Moderate Onset quality:  Sudden Timing:  Constant Progression:  Unchanged Chronicity:  New Relieved by:  Prescription medications and OTC medications Associated symptoms: no headaches, no myalgias, no sinus pain and no swollen glands   Risk factors: not elderly, no chronic cardiac disease, no chronic kidney disease, no chronic respiratory disease, no diabetes mellitus, no immunosuppression, no recent illness and no recent travel     Past Medical History:  Diagnosis Date  . CHF (congestive heart failure) (HCC)     There are no active problems to display for this patient.   History reviewed. No pertinent surgical history.     Home Medications    Prior to Admission medications   Medication Sig Start Date End Date Taking? Authorizing Provider  amoxicillin (AMOXIL) 875 MG tablet Take 1 tablet (875 mg total) by mouth 2 (two) times daily. 08/26/17   Renford DillsMiller, Lindsey, NP  azithromycin (ZITHROMAX Z-PAK) 250 MG tablet 2 tabs po once day 1, then 1 tab po qd for next 4 days 09/07/17   Payton Mccallumonty, Talor Cheema, MD  benzonatate (TESSALON) 200 MG capsule Take 1 capsule (200 mg total) by mouth 3 (three) times daily as needed for cough. 08/26/17   Renford DillsMiller, Lindsey, NP  fluconazole (DIFLUCAN) 150 MG tablet Take 1 tablet (150 mg total) by mouth daily. 09/07/17   Payton Mccallumonty, Verner Mccrone, MD  fluticasone (FLONASE) 50 MCG/ACT nasal spray Place 2 sprays into both nostrils daily. 11/25/15   Domenick GongMortenson, Ashley, MD    Family History Family History  Problem Relation Age of Onset  . Heart failure Mother     Social History Social History    Tobacco Use  . Smoking status: Never Smoker  . Smokeless tobacco: Never Used  Substance Use Topics  . Alcohol use: No  . Drug use: No     Allergies   Patient has no known allergies.   Review of Systems Review of Systems  HENT: Positive for congestion and rhinorrhea. Negative for sinus pain.   Respiratory: Positive for cough.   Musculoskeletal: Negative for myalgias.  Neurological: Negative for headaches.     Physical Exam Triage Vital Signs ED Triage Vitals  Enc Vitals Group     BP 09/07/17 1031 139/80     Pulse Rate 09/07/17 1031 81     Resp 09/07/17 1031 18     Temp 09/07/17 1031 98.3 F (36.8 C)     Temp src --      SpO2 09/07/17 1031 99 %     Weight 09/07/17 1030 263 lb (119.3 kg)     Height 09/07/17 1030 6\' 1"  (1.854 m)     Head Circumference --      Peak Flow --      Pain Score 09/07/17 1032 6     Pain Loc --      Pain Edu? --      Excl. in GC? --    No data found.  Updated Vital Signs BP 139/80 (BP Location: Left Arm)   Pulse 81   Temp 98.3 F (36.8 C)  Resp 18   Ht 6\' 1"  (1.854 m)   Wt 263 lb (119.3 kg)   SpO2 99%   BMI 34.70 kg/m   Visual Acuity Right Eye Distance:   Left Eye Distance:   Bilateral Distance:    Right Eye Near:   Left Eye Near:    Bilateral Near:     Physical Exam  Constitutional: He appears well-developed and well-nourished. No distress.  HENT:  Head: Normocephalic and atraumatic.  Right Ear: Tympanic membrane, external ear and ear canal normal.  Left Ear: Tympanic membrane, external ear and ear canal normal.  Nose: Nose normal.  Mouth/Throat: Uvula is midline, oropharynx is clear and moist and mucous membranes are normal. No oropharyngeal exudate or tonsillar abscesses.  Eyes: Conjunctivae and EOM are normal. Pupils are equal, round, and reactive to light. Right eye exhibits no discharge. Left eye exhibits no discharge. No scleral icterus.  Neck: Normal range of motion. Neck supple. No tracheal deviation present.  No thyromegaly present.  Cardiovascular: Normal rate, regular rhythm and normal heart sounds.  Pulmonary/Chest: Effort normal. No stridor. No respiratory distress. He has no wheezes. He has no rales. He exhibits no tenderness.  Rhonchi left base  Lymphadenopathy:    He has no cervical adenopathy.  Neurological: He is alert.  Skin: Skin is warm and dry. No rash noted. He is not diaphoretic.  Nursing note and vitals reviewed.    UC Treatments / Results  Labs (all labs ordered are listed, but only abnormal results are displayed) Labs Reviewed - No data to display  EKG  EKG Interpretation None       Radiology No results found.  Procedures Procedures (including critical care time)  Medications Ordered in UC Medications - No data to display   Initial Impression / Assessment and Plan / UC Course  I have reviewed the triage vital signs and the nursing notes.  Pertinent labs & imaging results that were available during my care of the patient were reviewed by me and considered in my medical decision making (see chart for details).       Final Clinical Impressions(s) / UC Diagnoses   Final diagnoses:  Cough  Acute bronchitis, unspecified organism  Dyspepsia    ED Discharge Orders        Ordered    azithromycin (ZITHROMAX Z-PAK) 250 MG tablet     09/07/17 1059    fluconazole (DIFLUCAN) 150 MG tablet  Daily     09/07/17 1059     1. diagnosis reviewed with patient 2. rx as per orders above; reviewed possible side effects, interactions, risks and benefits  3. Recommend supportive treatment with fluids, rest; otc antacid  4. Follow-up prn if symptoms worsen or don't improve  Controlled Substance Prescriptions [place] Controlled Substance Registry consulted? Not Applicable   Payton Mccallum, MD 09/07/17 1133

## 2017-09-07 NOTE — ED Triage Notes (Signed)
Pt reports he was here on 12/20 and had negative flu test and negative rapid strep but later found out his strep grew out positive. Also was treated for otitis media. Pt reports he is not feeling much better after finishing Amoxicillin.

## 2018-02-22 ENCOUNTER — Ambulatory Visit (INDEPENDENT_AMBULATORY_CARE_PROVIDER_SITE_OTHER): Payer: Self-pay

## 2018-02-22 ENCOUNTER — Ambulatory Visit
Admission: EM | Admit: 2018-02-22 | Discharge: 2018-02-22 | Disposition: A | Discharge status: Home / Self Care | Payer: Self-pay | Attending: Emergency Medicine | Admitting: Emergency Medicine

## 2018-02-22 DIAGNOSIS — Z113 Encounter for screening for infections with a predominantly sexual mode of transmission: Secondary | ICD-10-CM

## 2018-02-22 DIAGNOSIS — M79671 Pain in right foot: Secondary | ICD-10-CM

## 2018-02-22 DIAGNOSIS — R300 Dysuria: Secondary | ICD-10-CM

## 2018-02-22 DIAGNOSIS — R1030 Lower abdominal pain, unspecified: Secondary | ICD-10-CM

## 2018-02-22 LAB — URINALYSIS, COMPLETE (UACMP) WITH MICROSCOPIC
Bacteria, UA: NONE SEEN
Bilirubin Urine: NEGATIVE
GLUCOSE, UA: NEGATIVE mg/dL
Hgb urine dipstick: NEGATIVE
Ketones, ur: NEGATIVE mg/dL
Leukocytes, UA: NEGATIVE
Nitrite: NEGATIVE
Protein, ur: 30 mg/dL — AB
RBC / HPF: NONE SEEN RBC/hpf (ref 0–5)
Squamous Epithelial / LPF: NONE SEEN (ref 0–5)
pH: 5.5 (ref 5.0–8.0)

## 2018-02-22 LAB — CHLAMYDIA/NGC RT PCR (ARMC ONLY)
Chlamydia Tr: DETECTED — AB
N GONORRHOEAE: NOT DETECTED

## 2018-02-22 IMAGING — CR DG ANKLE COMPLETE 3+V*R*
3 series · 5 of 5 positions shown · non-contrast
Comparison: None.

CLINICAL DATA: Ankle pain for several weeks, initial encounter

EXAM:
RIGHT ANKLE - COMPLETE 3+ VIEW

[ankle ap]
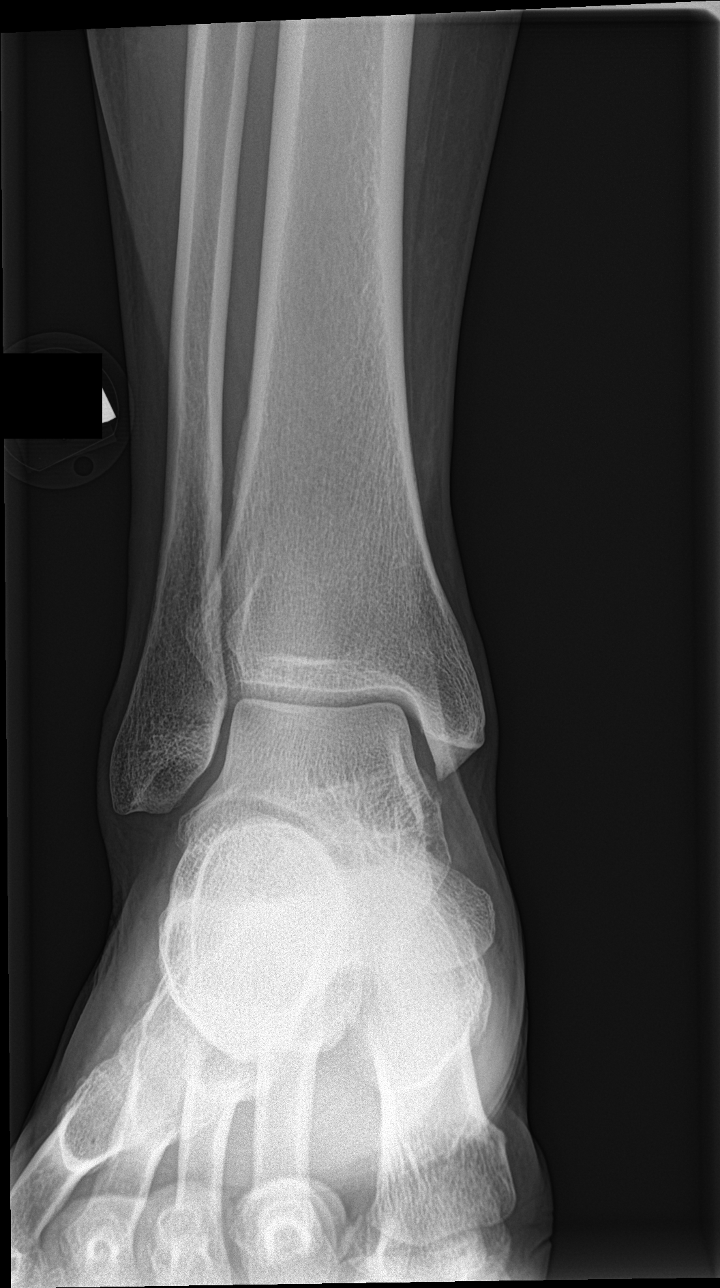

[Series 2: ankle obl · 0.14mm/px · 2 of 2 slices shown]
[im 1/2]
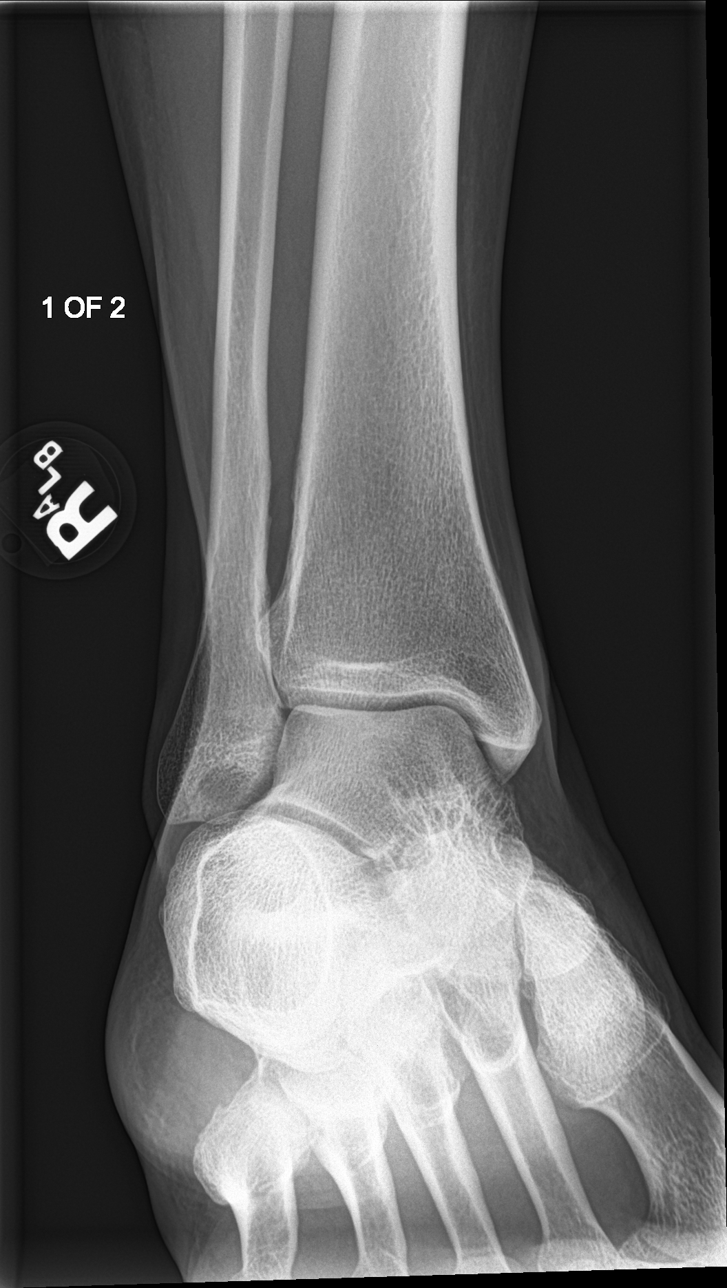
[im 2/2]
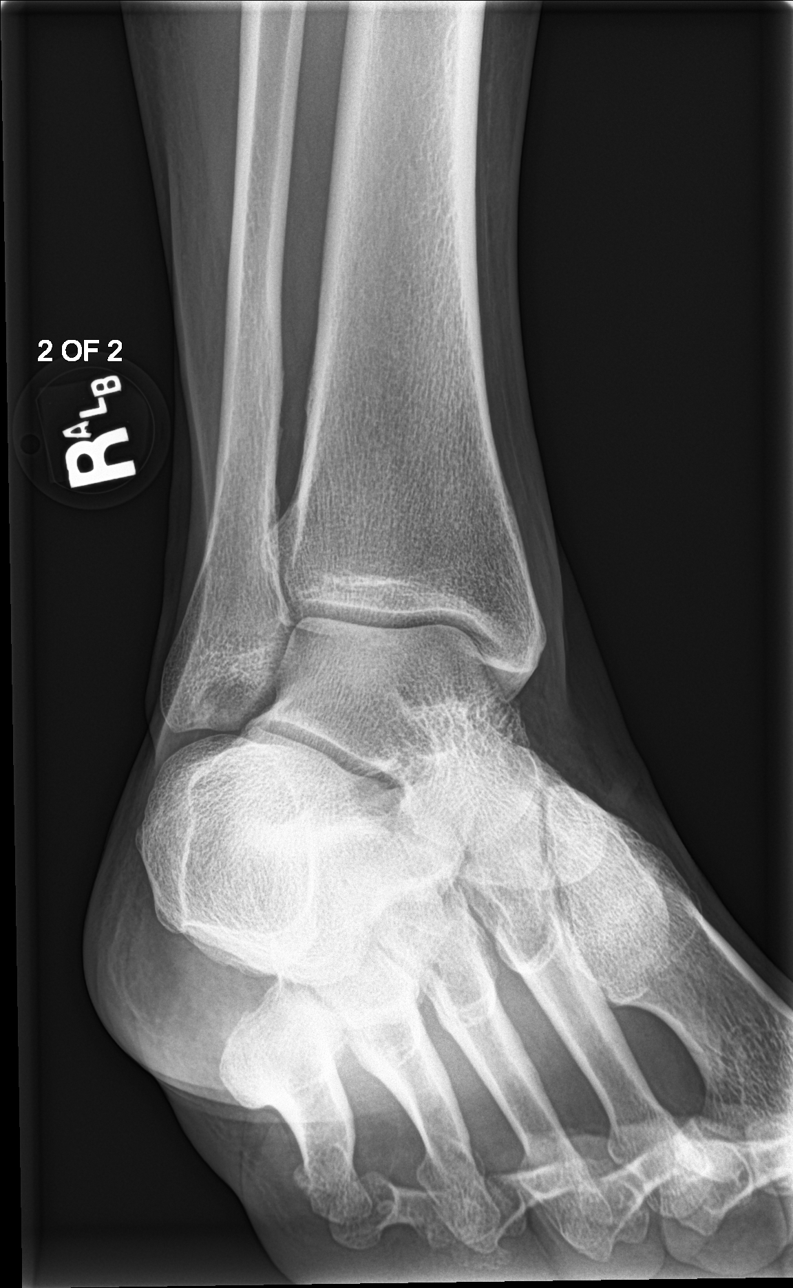

[Series 3: ankle lat · 0.14mm/px · 2 of 2 slices shown]
[im 1/2]
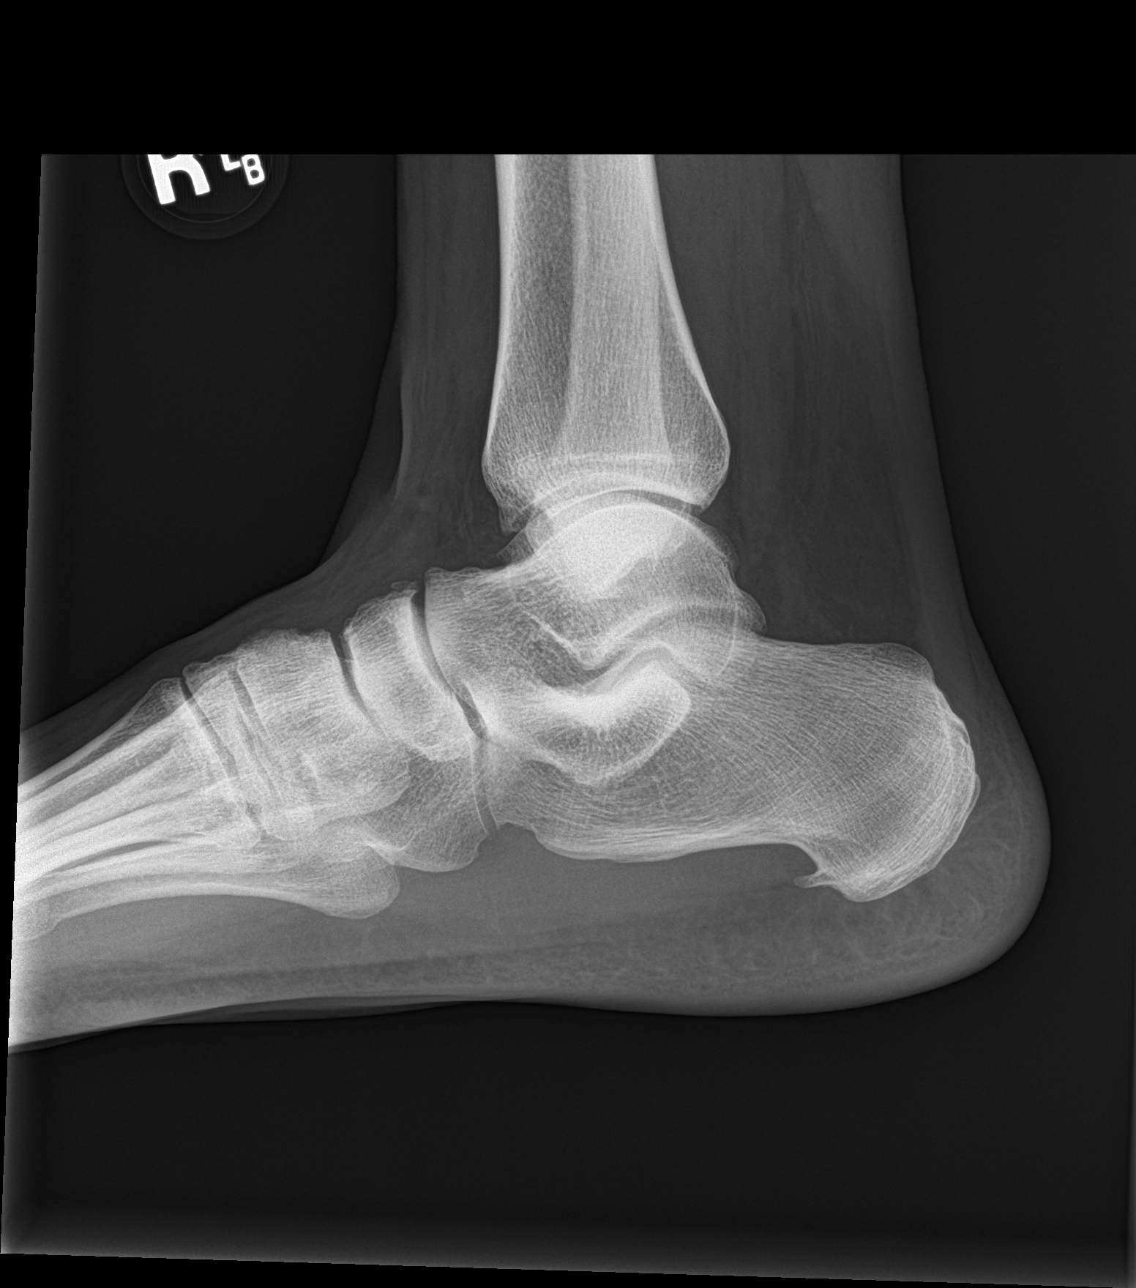
[im 2/2]
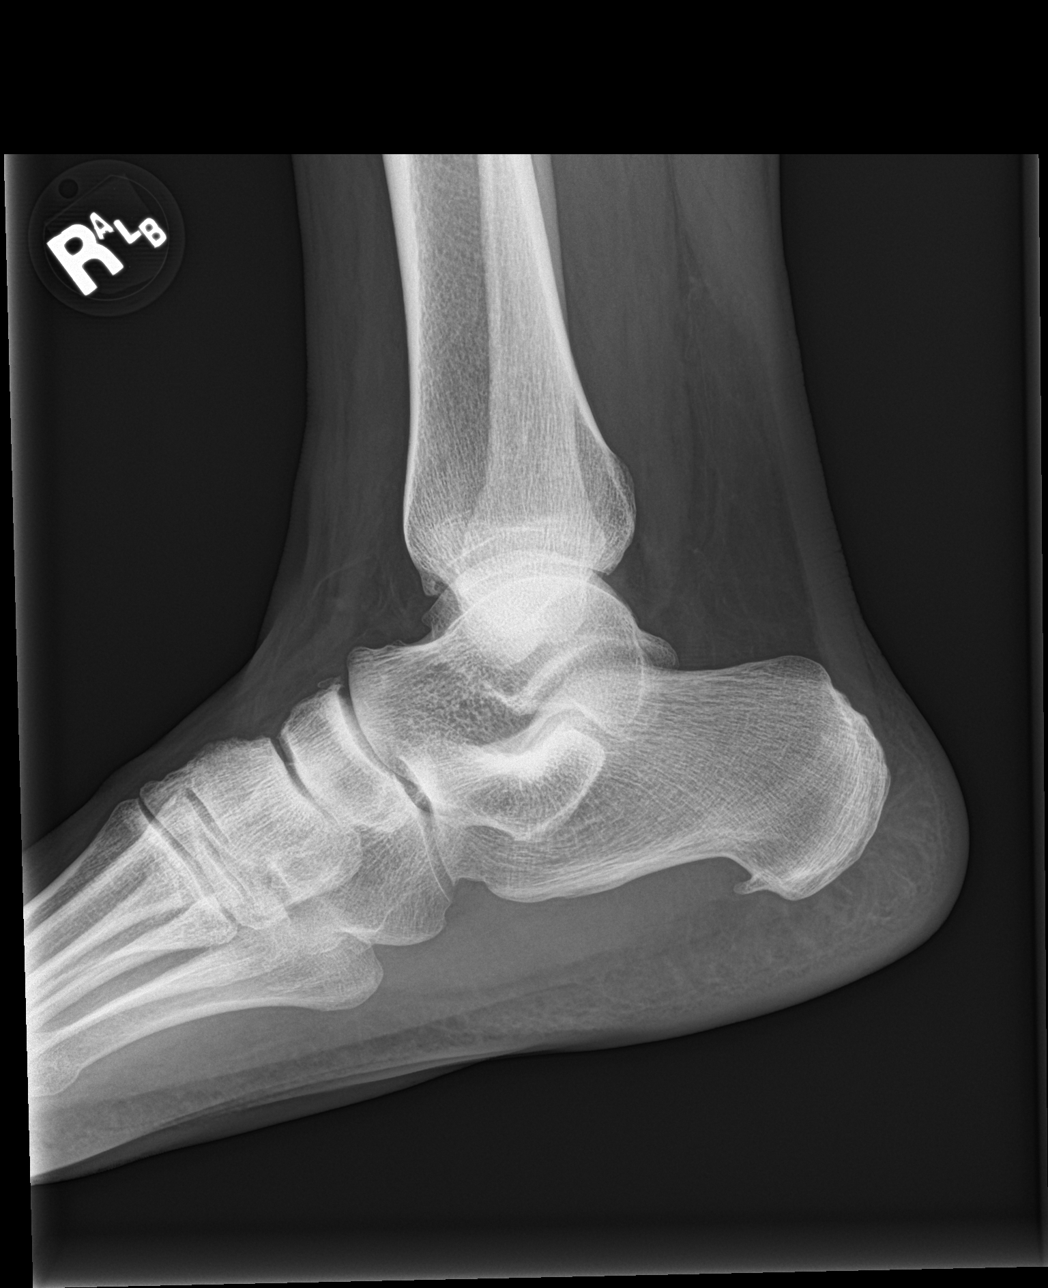

[5 of 5 positions shown; findings below may reference images not displayed]

FINDINGS: No acute fracture or dislocation is noted. Mild tarsal degenerative
changes are noted as well as calcaneal spurs. No soft tissue changes
are seen.
IMPRESSION: No acute abnormality noted.

## 2018-02-22 IMAGING — CR DG FOOT COMPLETE 3+V*R*
3 series · 3 of 3 positions shown · non-contrast
Comparison: None.

CLINICAL DATA: Foot pain for several weeks

EXAM:
RIGHT FOOT COMPLETE - 3+ VIEW

[foot ap]
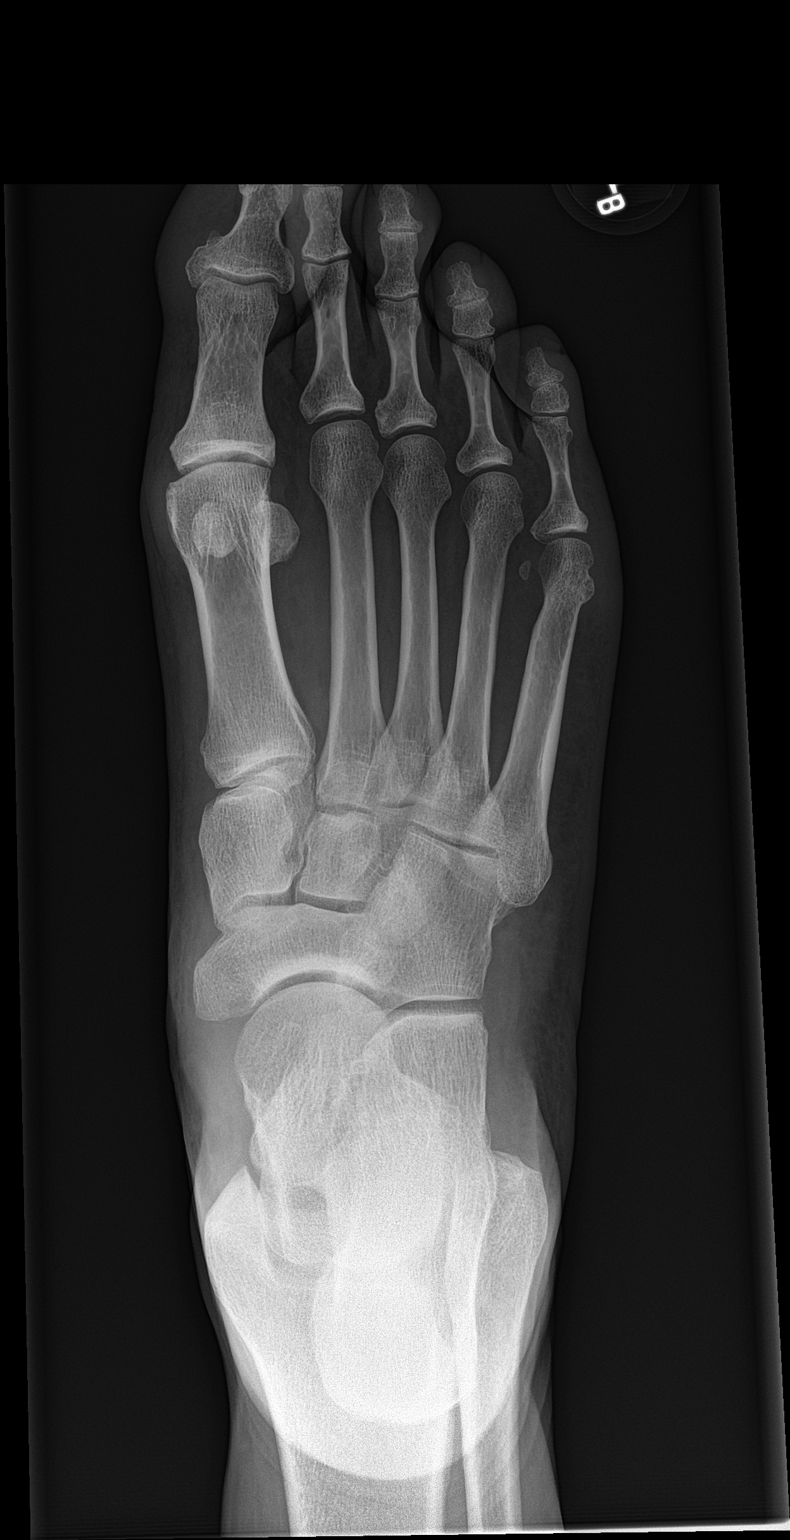

[foot obl]
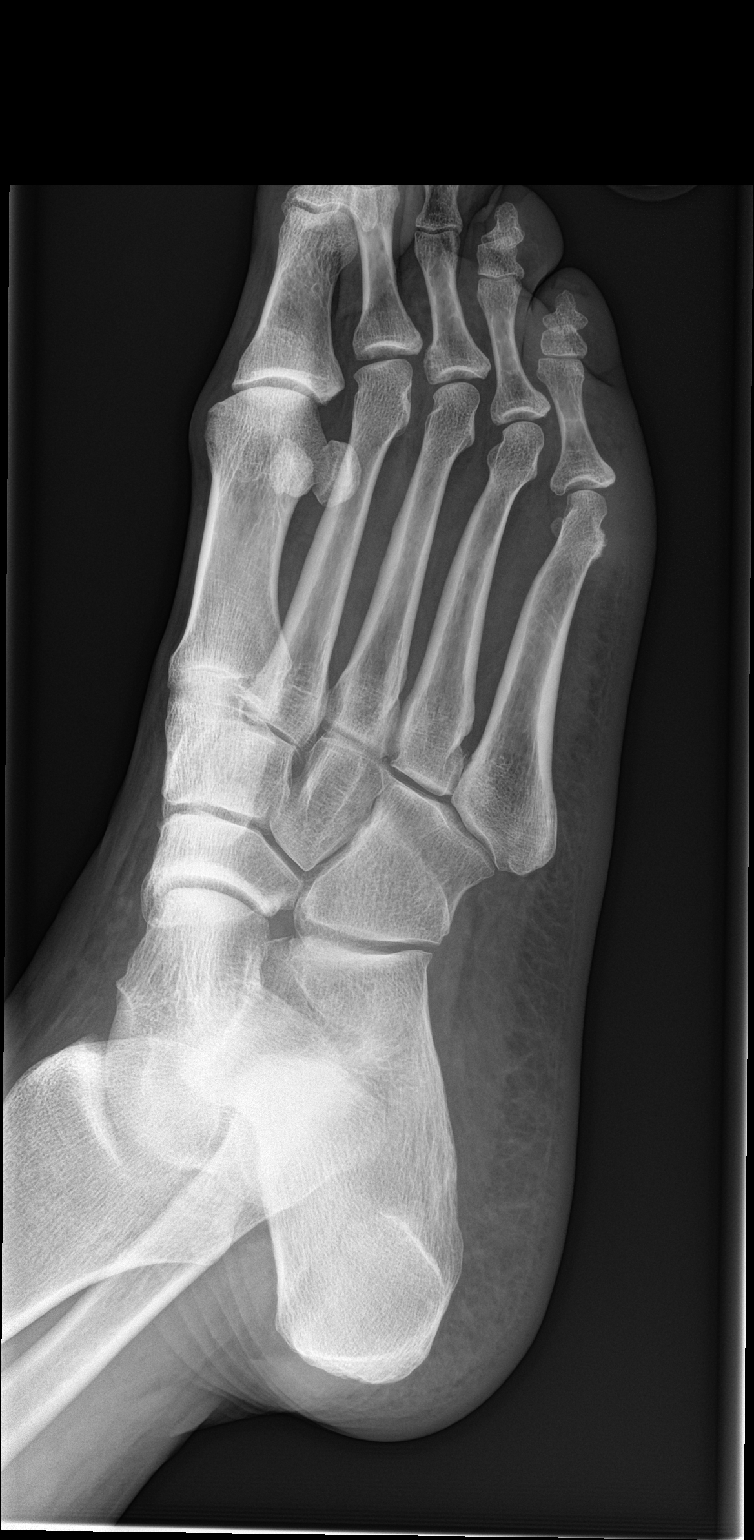

[foot lat]
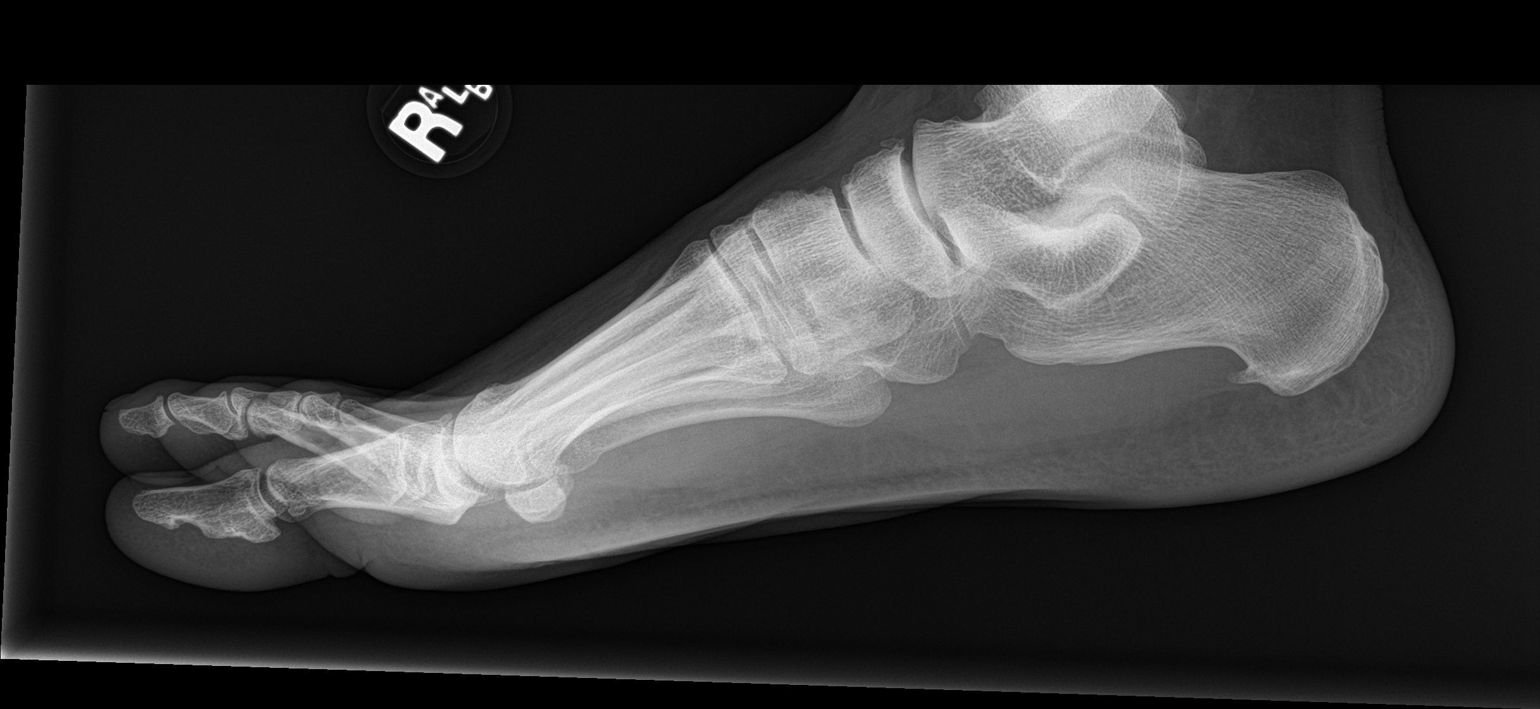

[3 of 3 positions shown; findings below may reference images not displayed]

FINDINGS: No acute fracture or dislocation is noted. Mild tarsal degenerative
changes are seen as well as calcaneal spurs.
IMPRESSION: Chronic changes without acute abnormality.

## 2018-02-22 MED ORDER — IBUPROFEN 600 MG PO TABS: 600.0000 mg | ORAL_TABLET | Freq: Four times a day (QID) | ORAL | 0 refills | Status: DC | PRN

## 2018-02-22 MED ORDER — CEFTRIAXONE SODIUM 250 MG IJ SOLR
250.0000 mg | Freq: Once | INTRAMUSCULAR | Status: AC
  Administered 2018-02-22: 250 mg via INTRAMUSCULAR

## 2018-02-22 MED ORDER — AZITHROMYCIN 500 MG PO TABS
1000.0000 mg | ORAL_TABLET | Freq: Once | ORAL | Status: AC
  Administered 2018-02-22: 1000 mg via ORAL

## 2018-02-22 NOTE — ED Triage Notes (Signed)
As per patient moved wrong way has some Right foot pain radiate to thigh area, ice and heat helped some , has occasional cramp but pain with ROM and now Left side started to have some pain. As per patient has some abdominal pain and wants to check urine specimen.

## 2018-02-22 NOTE — Discharge Instructions (Addendum)
600 mg of ibuprofen with 1 g of Tylenol 3 or 4 times a day as needed for pain.  This will help with the abdominal pain and your foot/ankle pain.  Continue the stretching.  Follow-up with the podiatrist if not better in a week.  We will call you if your lab results come back abnormal.  Give us a working phone number.  Go immediately to the ER for the signs and symptoms we discussed
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
## 2018-02-23 ENCOUNTER — Telehealth (HOSPITAL_COMMUNITY): Payer: Self-pay

## 2018-02-23 ENCOUNTER — Encounter: Payer: Self-pay | Admitting: Emergency Medicine

## 2018-02-23 LAB — MISC LABCORP TEST (SEND OUT): LABCORP TEST CODE: 188052

## 2018-02-23 NOTE — Telephone Encounter (Signed)
Chlamydia is positive.  This was treated at the urgent care visit with po zithromax 1g.  Need to educate pt to please refrain from sexual intercourse for 7 days to give the medicine time to work.  Sexual partners need to be notified and tested/treated.  Condoms may reduce risk of reinfection.  Recheck or followup with PCP for further evaluation if symptoms are not improving.  [facility] notified  Attempted to reach patient. Patient is not home. Encouraged family member to have patient call this rn back at his convenience.

## 2018-02-23 NOTE — Telephone Encounter (Signed)
Pt called back and is aware of positive std results.

## 2018-04-21 ENCOUNTER — Ambulatory Visit (INDEPENDENT_AMBULATORY_CARE_PROVIDER_SITE_OTHER): Payer: Self-pay

## 2018-04-21 ENCOUNTER — Other Ambulatory Visit: Payer: Self-pay

## 2018-04-21 ENCOUNTER — Ambulatory Visit
Admission: EM | Admit: 2018-04-21 | Discharge: 2018-04-21 | Disposition: A | Discharge status: Home / Self Care | Payer: Self-pay | Attending: Family Medicine | Admitting: Family Medicine

## 2018-04-21 ENCOUNTER — Encounter: Payer: Self-pay | Admitting: Emergency Medicine

## 2018-04-21 DIAGNOSIS — Z888 Allergy status to other drugs, medicaments and biological substances status: Secondary | ICD-10-CM | POA: Insufficient documentation

## 2018-04-21 DIAGNOSIS — Z87891 Personal history of nicotine dependence: Secondary | ICD-10-CM | POA: Insufficient documentation

## 2018-04-21 DIAGNOSIS — I509 Heart failure, unspecified: Secondary | ICD-10-CM | POA: Insufficient documentation

## 2018-04-21 DIAGNOSIS — R0789 Other chest pain: Secondary | ICD-10-CM

## 2018-04-21 DIAGNOSIS — R0602 Shortness of breath: Secondary | ICD-10-CM

## 2018-04-21 DIAGNOSIS — R05 Cough: Secondary | ICD-10-CM | POA: Insufficient documentation

## 2018-04-21 DIAGNOSIS — Z8249 Family history of ischemic heart disease and other diseases of the circulatory system: Secondary | ICD-10-CM | POA: Insufficient documentation

## 2018-04-21 LAB — CBC WITH DIFFERENTIAL/PLATELET
Basophils Absolute: 0.1 10*3/uL (ref 0–0.1)
Basophils Relative: 1 %
Eosinophils Absolute: 0.1 10*3/uL (ref 0–0.7)
Eosinophils Relative: 1 %
HCT: 43.4 % (ref 40.0–52.0)
Hemoglobin: 14.7 g/dL (ref 13.0–18.0)
Lymphocytes Relative: 33 %
Lymphs Abs: 2.1 10*3/uL (ref 1.0–3.6)
MCH: 30.3 pg (ref 26.0–34.0)
MCHC: 33.9 g/dL (ref 32.0–36.0)
MCV: 89.4 fL (ref 80.0–100.0)
Monocytes Absolute: 0.4 10*3/uL (ref 0.2–1.0)
Monocytes Relative: 7 %
Neutro Abs: 3.7 10*3/uL (ref 1.4–6.5)
Neutrophils Relative %: 58 %
Platelets: 245 10*3/uL (ref 150–440)
RBC: 4.85 MIL/uL (ref 4.40–5.90)
RDW: 14.3 % (ref 11.5–14.5)
WBC: 6.4 10*3/uL (ref 3.8–10.6)

## 2018-04-21 LAB — COMPREHENSIVE METABOLIC PANEL WITH GFR
ALT: 18 U/L (ref 0–44)
AST: 28 U/L (ref 15–41)
Albumin: 3.8 g/dL (ref 3.5–5.0)
Alkaline Phosphatase: 69 U/L (ref 38–126)
Anion gap: 11 (ref 5–15)
BUN: 13 mg/dL (ref 6–20)
CO2: 23 mmol/L (ref 22–32)
Calcium: 9 mg/dL (ref 8.9–10.3)
Chloride: 107 mmol/L (ref 98–111)
Creatinine, Ser: 1.06 mg/dL (ref 0.61–1.24)
GFR calc Af Amer: >60 mL/min
GFR calc non Af Amer: >60 mL/min
Glucose, Bld: 111 mg/dL — ABNORMAL HIGH (ref 70–99)
Potassium: 3.7 mmol/L (ref 3.5–5.1)
Sodium: 141 mmol/L (ref 135–145)
Total Bilirubin: 0.5 mg/dL (ref 0.3–1.2)
Total Protein: 7 g/dL (ref 6.5–8.1)

## 2018-04-21 LAB — TROPONIN I: Troponin I: <0.03 ng/mL

## 2018-04-21 IMAGING — CR DG CHEST 2V
2 series · 2 of 2 positions shown · non-contrast
Comparison: [DATE]

CLINICAL DATA: Mid sternal chest pain and shortness of breath.

EXAM:
CHEST - 2 VIEW

[chest pa]
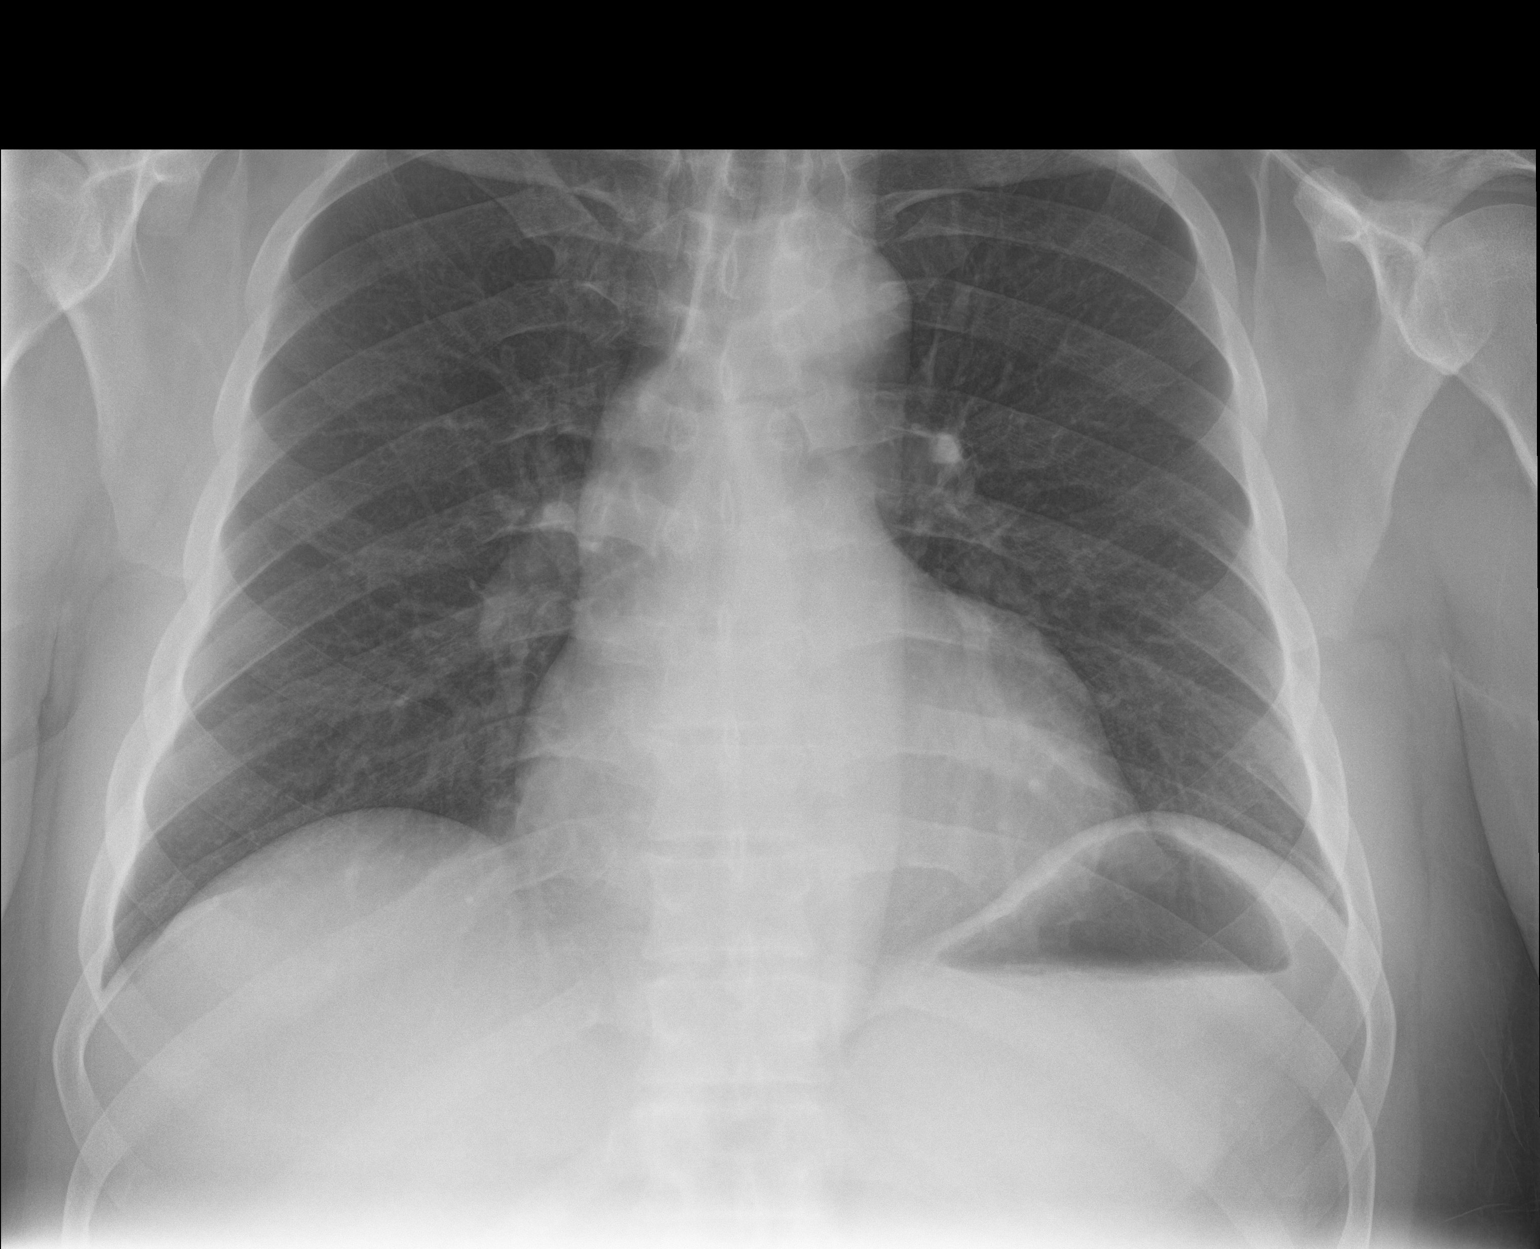

[chest lat]
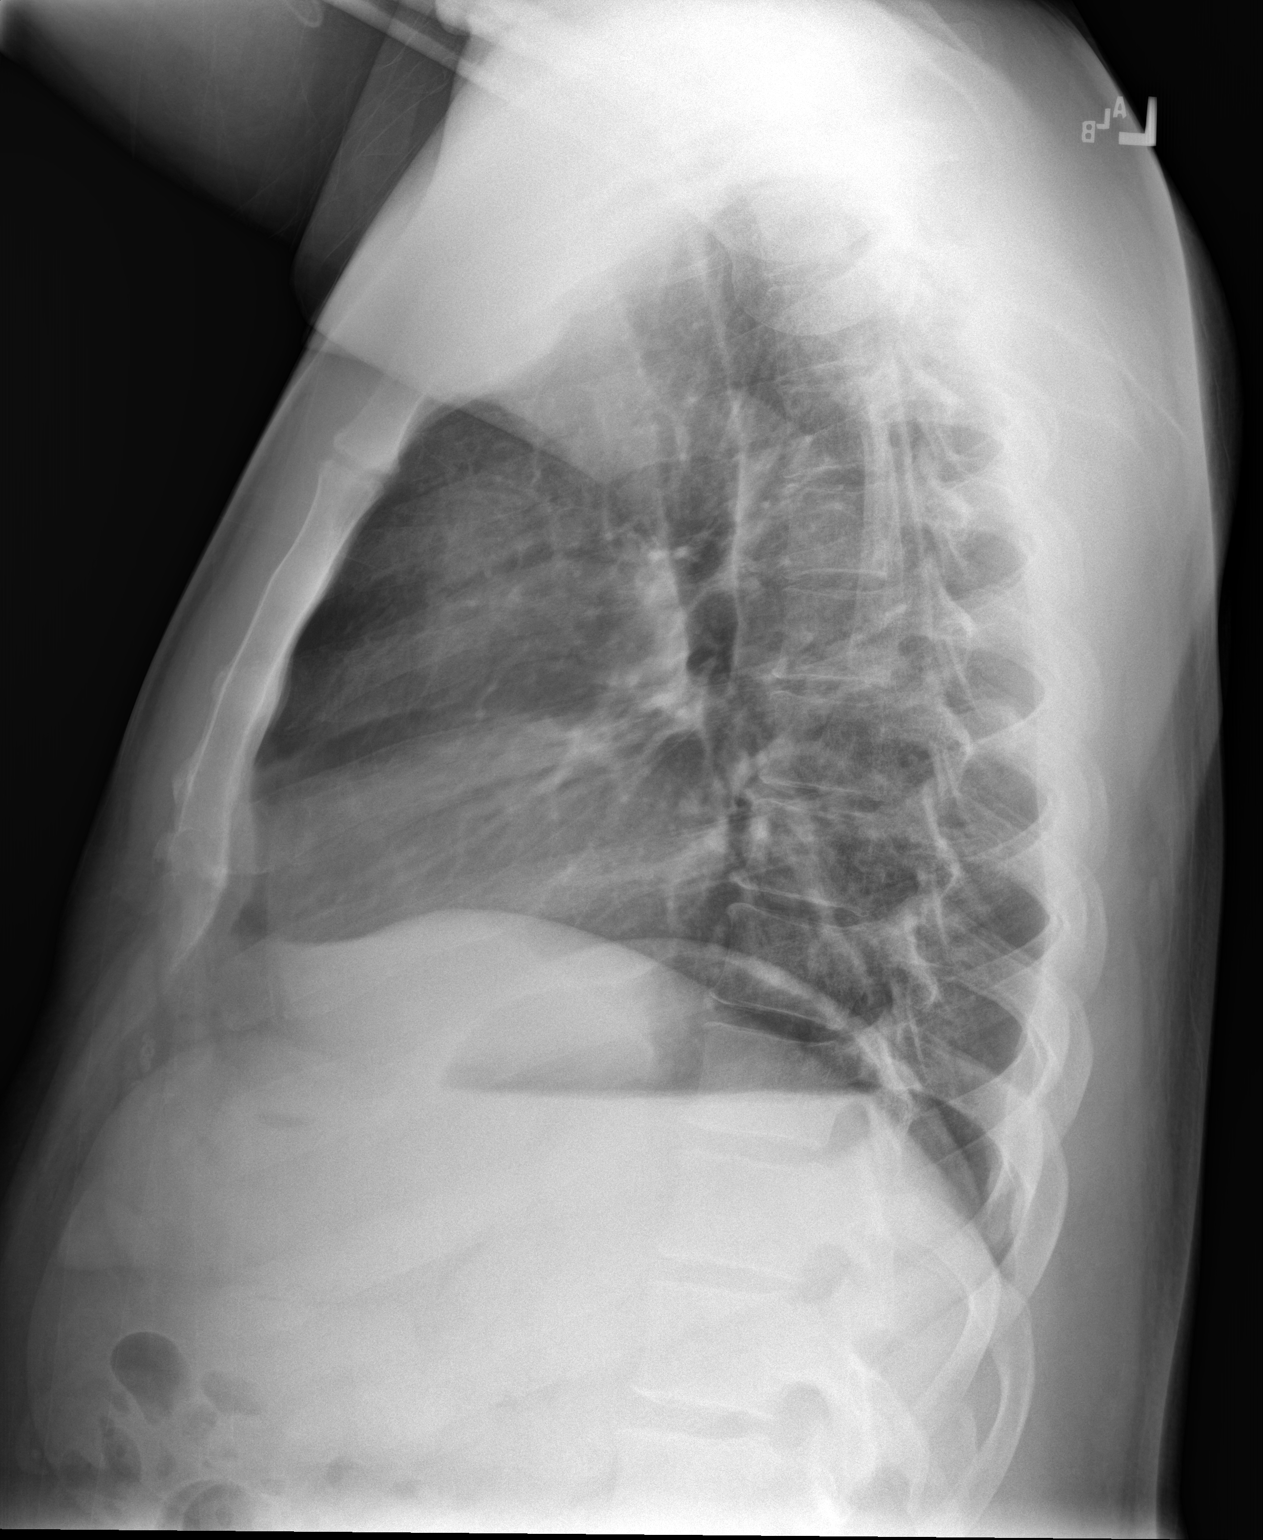

[2 of 2 positions shown; findings below may reference images not displayed]

FINDINGS: Cardiomegaly. Tortuous aorta. The pulmonary vascularity is normal.
Lungs are clear. No effusions. No acute bone finding.
IMPRESSION: Cardiomegaly and aortic atherosclerosis. Otherwise no active
disease.

## 2018-04-21 MED ORDER — ALBUTEROL SULFATE HFA 108 (90 BASE) MCG/ACT IN AERS: 1.0000 | INHALATION_SPRAY | Freq: Four times a day (QID) | RESPIRATORY_TRACT | 0 refills | Status: DC | PRN

## 2018-04-21 NOTE — ED Triage Notes (Signed)
Pt c/o chest pain for the last 4-5 days. He has some SOB. He mostly feels the pain in his chest when he takes a deep breath and a burning sensation. He is coughing up brown colored sputum in the mornings.

## 2018-04-21 NOTE — Discharge Instructions (Signed)
Use the inhaler as needed.  Everything checked out ok!  If you worsen or don't improve, let us know.  Take care  Dr. Adriana Simasook

## 2018-04-21 NOTE — ED Provider Notes (Signed)
[facility]  CSN: [id number] Arrival date & time: 04/21/18  1234  History   Chief Complaint Chief Complaint  Patient presents with  . Chest Pain   HPI  42 year old male presents with the above complaint.  Patient reports a 4 to 5-day history of intermittent chest pain.  He describes the pain as a tightness and a burning sensation.  He states that it occurs quite often when he takes a deep breath.  Located centrally.  No radiation.  Patient states that he has had some mild cough which is productive of brown sputum particularly in the mornings.  Reports associated shortness of breath as well.  He denies exertional component.  No fevers or chills.  Patient does have a history of CHF.  Patient states that he has been losing weight.  No reports of edema.  No known relieving factors.  No other associated symptoms.  No other complaints.  Past Medical History:  Diagnosis Date  . CHF (congestive heart failure) (HCC)    Past Surgical History:  Procedure Laterality Date  . NO PAST SURGERIES     Home Medications    Prior to Admission medications   Medication Sig Start Date End Date Taking? Authorizing Provider  albuterol (PROVENTIL HFA;VENTOLIN HFA) 108 (90 Base) MCG/ACT inhaler Inhale 1-2 puffs into the lungs every 6 (six) hours as needed for wheezing or shortness of breath. 04/21/18   Tommie Samsook, Yan Pankratz G, DO   Family History Family History  Problem Relation Age of Onset  . Heart failure Mother   . Healthy Father    Social History Social History   Tobacco Use  . Smoking status: Former Games developermoker  . Smokeless tobacco: Never Used  Substance Use Topics  . Alcohol use: Yes    Comment: ocassionally  . Drug use: No   Allergies   Iodine  Review of Systems Review of Systems  Constitutional: Negative for fever.  Respiratory: Positive for cough, chest tightness and shortness of breath.   Cardiovascular: Positive for chest pain.   Physical Exam Triage Vital Signs ED Triage Vitals  [04/21/18 1246]  Enc Vitals Group     BP 133/84     Pulse Rate 81     Resp 17     Temp 98.9 F (37.2 C)     Temp Source Oral     SpO2 98 %     Weight 250 lb (113.4 kg)     Height 6\' 1"  (1.854 m)     Head Circumference      Peak Flow      Pain Score 7     Pain Loc      Pain Edu?      Excl. in GC?    Updated Vital Signs BP 133/84 (BP Location: Left Arm)   Pulse 81   Temp 98.9 F (37.2 C) (Oral)   Resp 17   Ht 6\' 1"  (1.854 m)   Wt 113.4 kg   SpO2 98%   BMI 32.98 kg/m   Visual Acuity Right Eye Distance:   Left Eye Distance:   Bilateral Distance:    Right Eye Near:   Left Eye Near:    Bilateral Near:     Physical Exam  Constitutional: He is oriented to person, place, and time. He appears well-developed. No distress.  HENT:  Head: Normocephalic and atraumatic.  Mouth/Throat: Oropharynx is clear and moist.  Cardiovascular: Normal rate and regular rhythm.  Pulmonary/Chest: Effort normal and breath sounds normal. He has no wheezes.  He has no rales.  Neurological: He is alert and oriented to person, place, and time.  Psychiatric: He has a normal mood and affect. His behavior is normal.  Nursing note and vitals reviewed.  UC Treatments / Results  Labs (all labs ordered are listed, but only abnormal results are displayed) Labs Reviewed  COMPREHENSIVE METABOLIC PANEL - Abnormal; Notable for the following components:      Result Value   Glucose, Bld 111 (*)    All other components within normal limits  CBC WITH DIFFERENTIAL/PLATELET  TROPONIN I    EKG Interpretation: Normal sinus rhythm at the rate of 75.  Normal axis.  Normal intervals.  No ST or T wave changes.  Normal EKG.  Radiology Dg Chest 2 View  Result Date: 04/21/2018 CLINICAL DATA:  Mid sternal chest pain and shortness of breath. EXAM: CHEST - 2 VIEW COMPARISON:  02/09/2006 FINDINGS: Cardiomegaly. Tortuous aorta. The pulmonary vascularity is normal. Lungs are clear. No effusions. No acute bone finding.  IMPRESSION: Cardiomegaly and aortic atherosclerosis. Otherwise no active disease. Electronically Signed   By: Paulina FusiMark  Shogry M.D.   On: 04/21/2018 13:25    Procedures Procedures (including critical care time)  Medications Ordered in UC Medications - No data to display  Initial Impression / Assessment and Plan / UC Course  I have reviewed the triage vital signs and the nursing notes.  Pertinent labs & imaging results that were available during my care of the patient were reviewed by me and considered in my medical decision making (see chart for details).    42 year old male presents with chest pain.  EKG normal.  Chest x-ray with cardiomegaly but no acute abnormality.  No infiltrate.  Labs unremarkable.  Advised that this is likely secondary to a viral process and/or contributed to by his smoking.  Albuterol as needed.  Supportive care.  Final Clinical Impressions(s) / UC Diagnoses   Final diagnoses:  Other chest pain     Discharge Instructions     Use the inhaler as needed.  Everything checked out ok!  If you worsen or don't improve, let us know.  Take care  Dr. Adriana Simasook     ED Prescriptions    Medication Sig Dispense Auth. Provider   albuterol (PROVENTIL HFA;VENTOLIN HFA) 108 (90 Base) MCG/ACT inhaler Inhale 1-2 puffs into the lungs every 6 (six) hours as needed for wheezing or shortness of breath. 1 Inhaler Tommie Samsook, Carsen Leaf G, DO     Controlled Substance Prescriptions [place] Controlled Substance Registry consulted? Not Applicable   Tommie SamsCook, Adreana Coull G, DO 04/21/18 1401

## 2018-05-30 ENCOUNTER — Other Ambulatory Visit: Payer: Self-pay

## 2018-05-30 ENCOUNTER — Ambulatory Visit
Admission: EM | Admit: 2018-05-30 | Discharge: 2018-05-30 | Disposition: A | Discharge status: Home / Self Care | Payer: Self-pay | Attending: Physician Assistant | Admitting: Physician Assistant

## 2018-05-30 ENCOUNTER — Ambulatory Visit (INDEPENDENT_AMBULATORY_CARE_PROVIDER_SITE_OTHER): Payer: Self-pay

## 2018-05-30 DIAGNOSIS — M1712 Unilateral primary osteoarthritis, left knee: Secondary | ICD-10-CM

## 2018-05-30 DIAGNOSIS — M25562 Pain in left knee: Secondary | ICD-10-CM

## 2018-05-30 IMAGING — CR DG KNEE COMPLETE 4+V*L*
4 series · 4 of 4 positions shown · non-contrast
Comparison: None.

CLINICAL DATA: Left knee pain. Pain in the anterior peripatellar
region.

EXAM:
LEFT KNEE - COMPLETE 4+ VIEW

[knee ap]
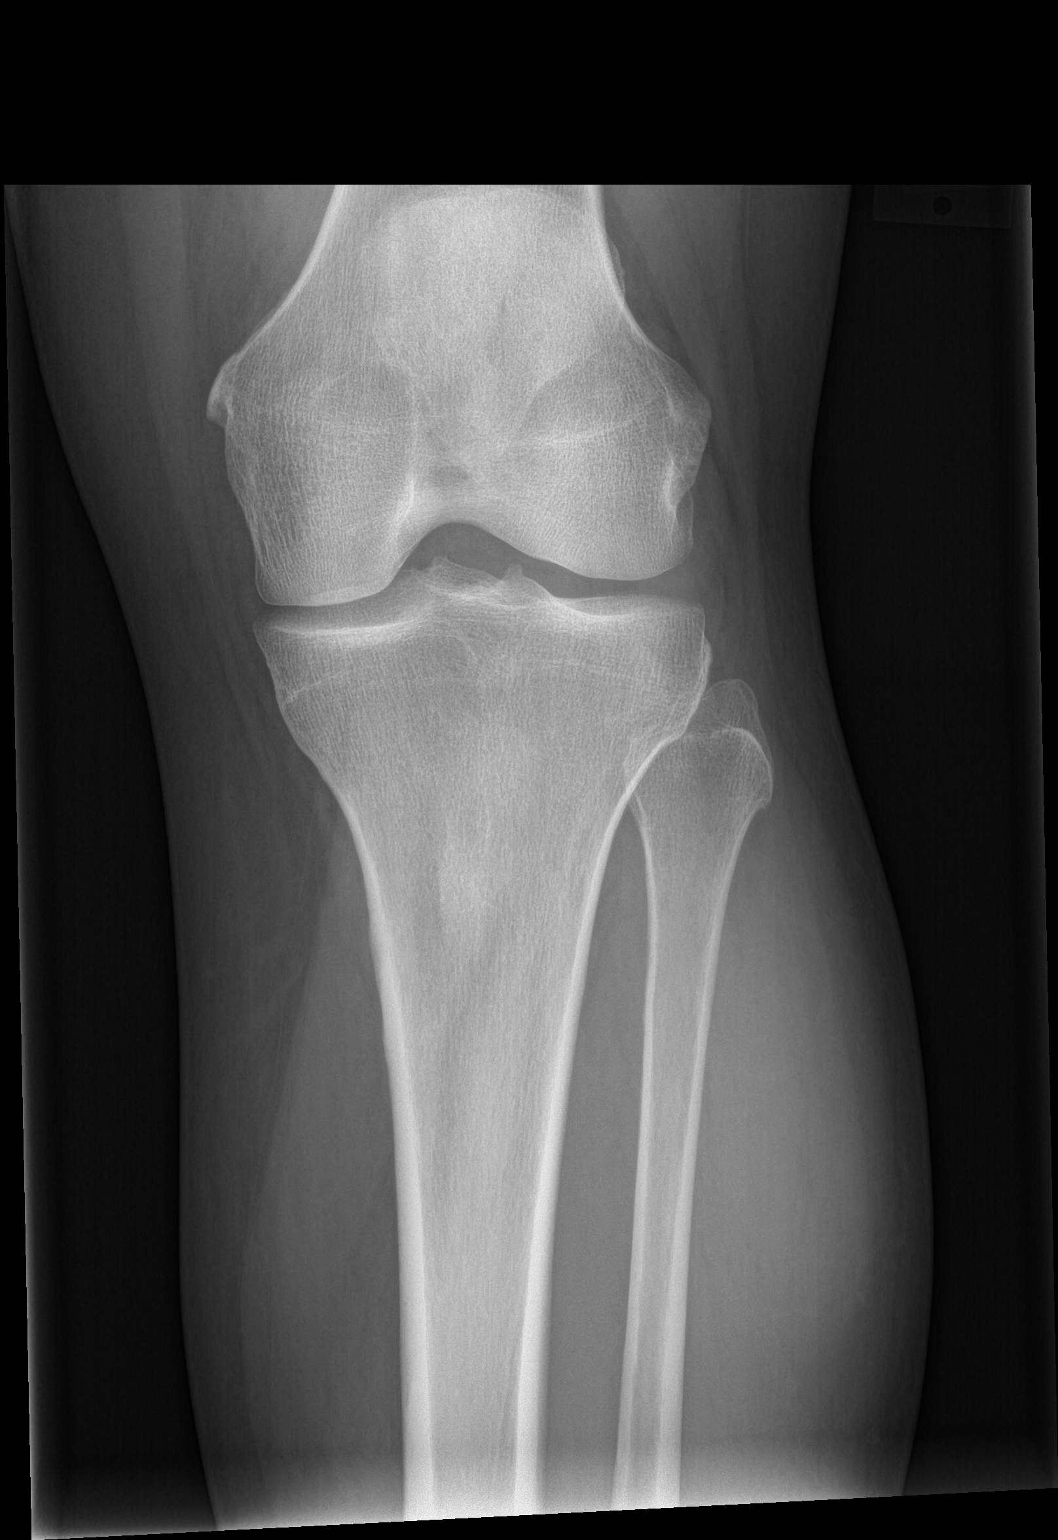

[knee lat]
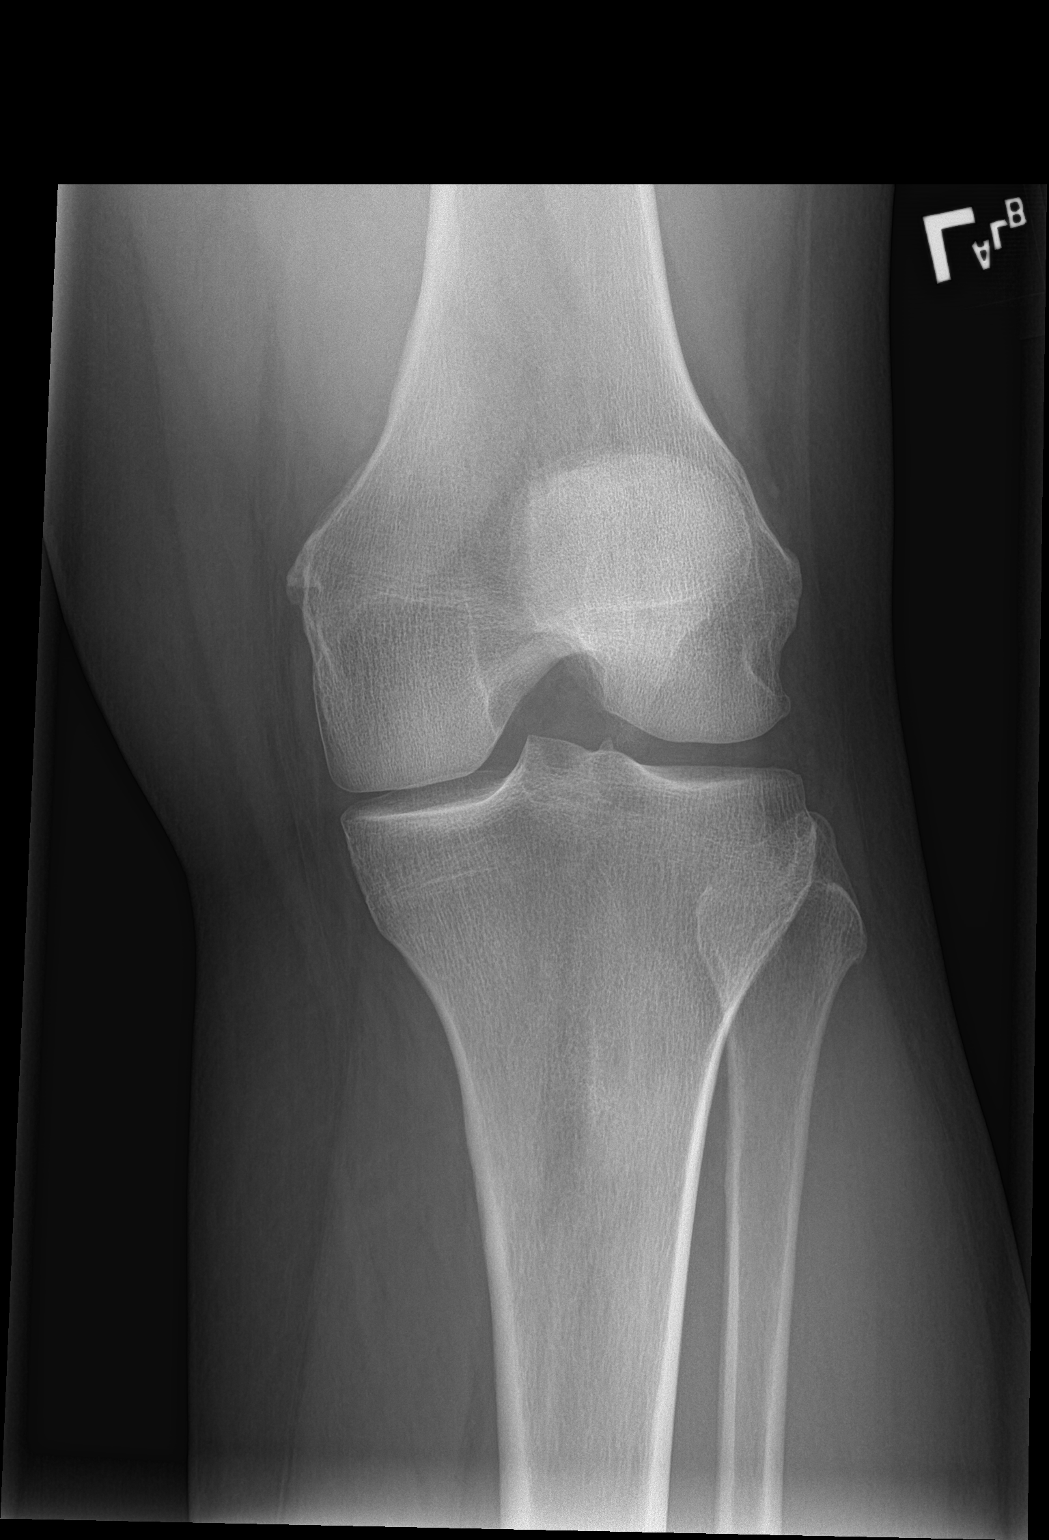

[tunnel]
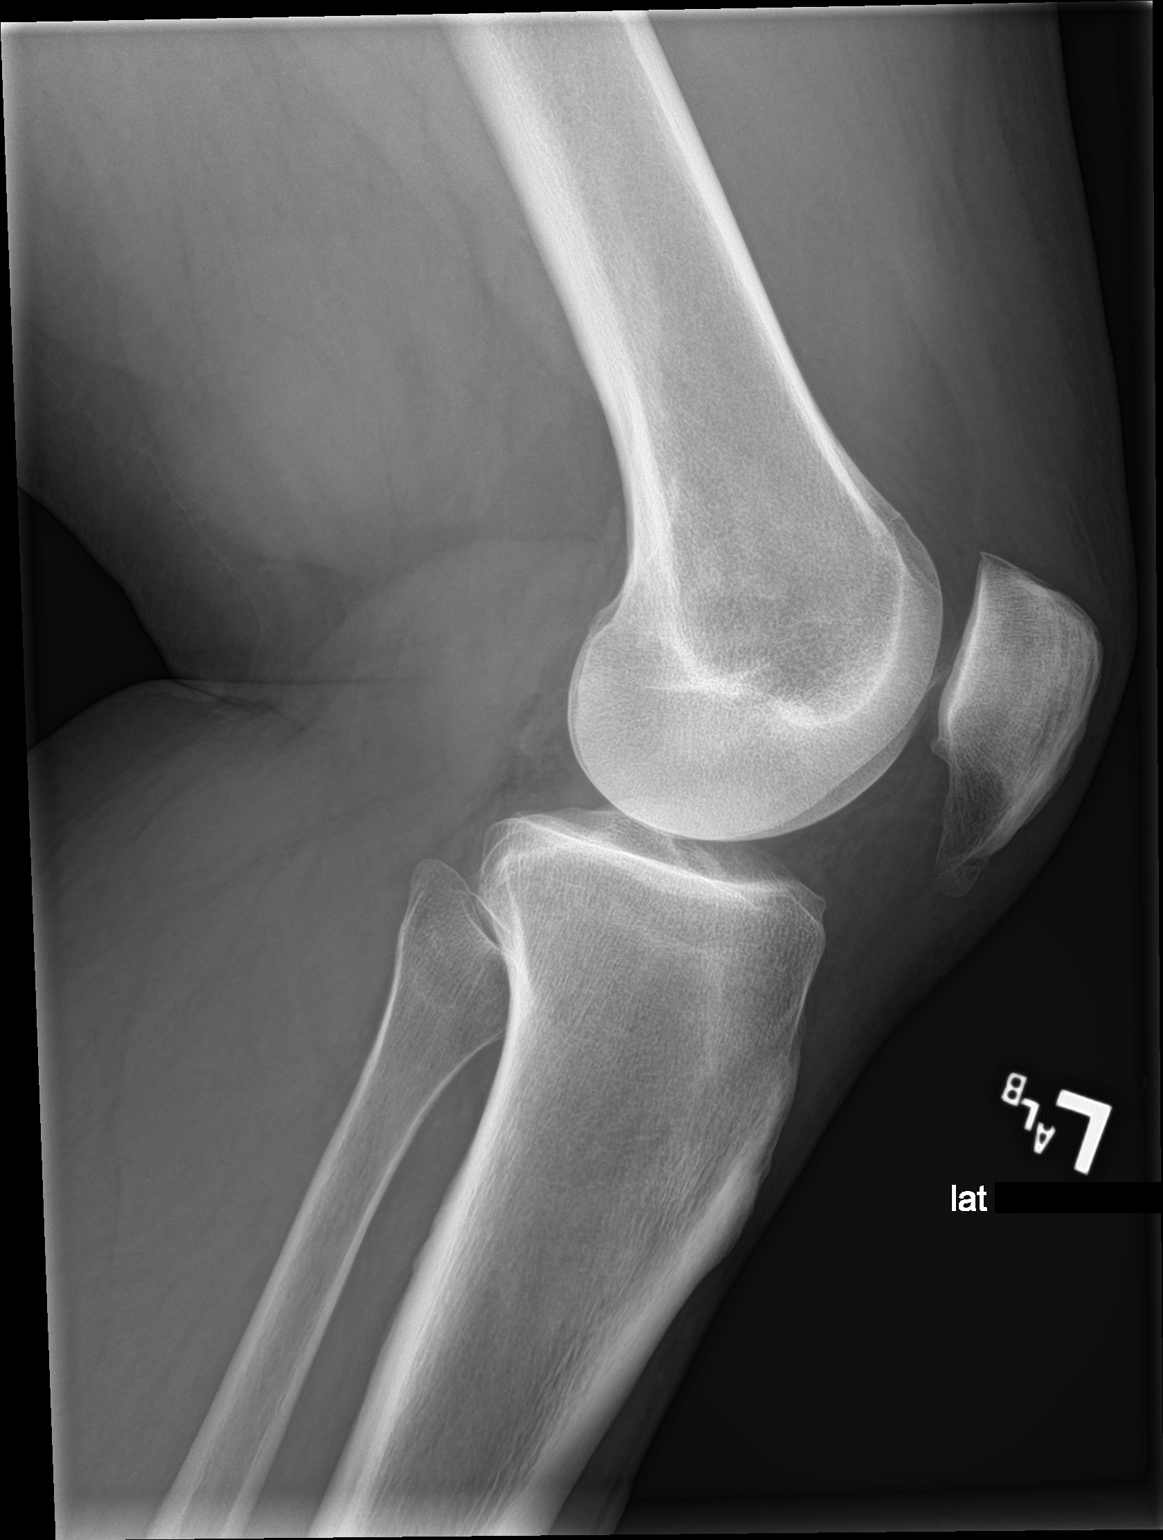

[patella skyline]
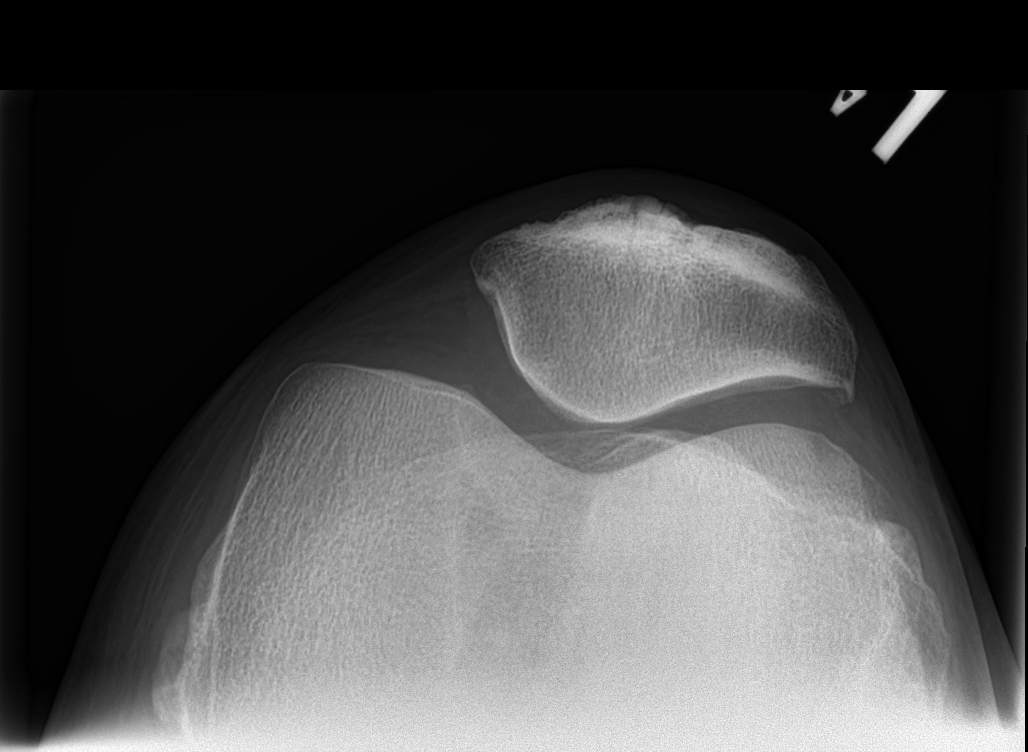

[4 of 4 positions shown; findings below may reference images not displayed]

FINDINGS: Negative for fracture, dislocation or large joint effusion. Mild
medial compartment narrowing. Enthesopathic changes along the
inferior aspect of the patella that appear chronic. Mild
degenerative changes in the patellofemoral compartment of the knee.
No focal soft tissue abnormality.
IMPRESSION: No acute abnormality to left knee.

Mild degenerative changes in left knee.

Enthesopathic changes involving the inferior aspect of the patella.

## 2018-05-30 MED ORDER — IBUPROFEN 800 MG PO TABS: 800.0000 mg | ORAL_TABLET | Freq: Three times a day (TID) | ORAL | 0 refills | Status: AC
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
## 2018-05-30 NOTE — Discharge Instructions (Signed)
KNEE PAIN/ARTHRITIS: Your x-rays today indicate mild arthritis of  the knee, most significant of the inside of the knee--which is where your pain is the worst. Stressed avoiding painful activities . Wear supportive knee brace and consider OTC joint supplement such as Osteo-Biflex. PRICE guidelines reviewed. Use medications as directed. Take NSAIDs for pain and inflammation. F/u with ortho or PCP for reexamination or our office sooner for any questions or concerns you may have and we will be happy to help you!
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
## 2019-03-20 ENCOUNTER — Encounter: Payer: Self-pay | Admitting: Emergency Medicine

## 2019-03-20 ENCOUNTER — Other Ambulatory Visit: Payer: Self-pay

## 2019-03-20 DIAGNOSIS — R079 Chest pain, unspecified: Secondary | ICD-10-CM | POA: Insufficient documentation

## 2019-03-20 DIAGNOSIS — I509 Heart failure, unspecified: Secondary | ICD-10-CM | POA: Insufficient documentation

## 2019-03-20 DIAGNOSIS — F17210 Nicotine dependence, cigarettes, uncomplicated: Secondary | ICD-10-CM | POA: Insufficient documentation

## 2019-03-20 LAB — BASIC METABOLIC PANEL
Anion gap: 9 (ref 5–15)
BUN: 12 mg/dL (ref 6–20)
CO2: 25 mmol/L (ref 22–32)
Calcium: 9.1 mg/dL (ref 8.9–10.3)
Chloride: 106 mmol/L (ref 98–111)
Creatinine, Ser: 1.16 mg/dL (ref 0.61–1.24)
GFR calc Af Amer: >60 mL/min (ref >60)
GFR calc non Af Amer: >60 mL/min (ref >60)
Glucose, Bld: 112 mg/dL — ABNORMAL HIGH (ref 70–99)
Potassium: 3.8 mmol/L (ref 3.5–5.1)
Sodium: 140 mmol/L (ref 135–145)

## 2019-03-20 LAB — CBC
HCT: 45.3 % (ref 39.0–52.0)
Hemoglobin: 14.7 g/dL (ref 13.0–17.0)
MCH: 29.3 pg (ref 26.0–34.0)
MCHC: 32.5 g/dL (ref 30.0–36.0)
MCV: 90.4 fL (ref 80.0–100.0)
Platelets: 282 10*3/uL (ref 150–400)
RBC: 5.01 MIL/uL (ref 4.22–5.81)
RDW: 13.4 % (ref 11.5–15.5)
WBC: 7.3 10*3/uL (ref 4.0–10.5)
nRBC: 0 % (ref 0.0–0.2)

## 2019-03-20 LAB — TROPONIN I (HIGH SENSITIVITY): Troponin I (High Sensitivity): 3 ng/L (ref <18)

## 2019-03-20 MED ORDER — SODIUM CHLORIDE 0.9% FLUSH: 3.0000 mL | Freq: Once | INTRAVENOUS | Status: DC

## 2019-03-20 NOTE — ED Triage Notes (Signed)
Pt presents to ED with sharp left sided chest pain that radiates from the center of his chest to the left side of his body since Friday. Pt denies sob. Pt states he has noticed an increase in his appetite since the onset of his symptoms. Pt alert and calm with no increased work of breathing or acute distress noted at this time.

## 2019-03-21 ENCOUNTER — Emergency Department: Payer: Self-pay

## 2019-03-21 ENCOUNTER — Emergency Department
Admission: EM | Admit: 2019-03-21 | Discharge: 2019-03-21 | Disposition: A | Discharge status: Home / Self Care | Payer: Self-pay | Attending: Student in an Organized Health Care Education/Training Program | Admitting: Student in an Organized Health Care Education/Training Program

## 2019-03-21 DIAGNOSIS — R079 Chest pain, unspecified: Secondary | ICD-10-CM

## 2019-03-21 DIAGNOSIS — F439 Reaction to severe stress, unspecified: Secondary | ICD-10-CM

## 2019-03-21 LAB — TROPONIN I (HIGH SENSITIVITY): Troponin I (High Sensitivity): 4 ng/L (ref <18)

## 2019-03-21 IMAGING — CR CHEST - 2 VIEW
2 series · 2 of 2 positions shown · non-contrast
Comparison: [DATE]

CLINICAL DATA: Chest pain

EXAM:
CHEST - 2 VIEW

[chest pa]
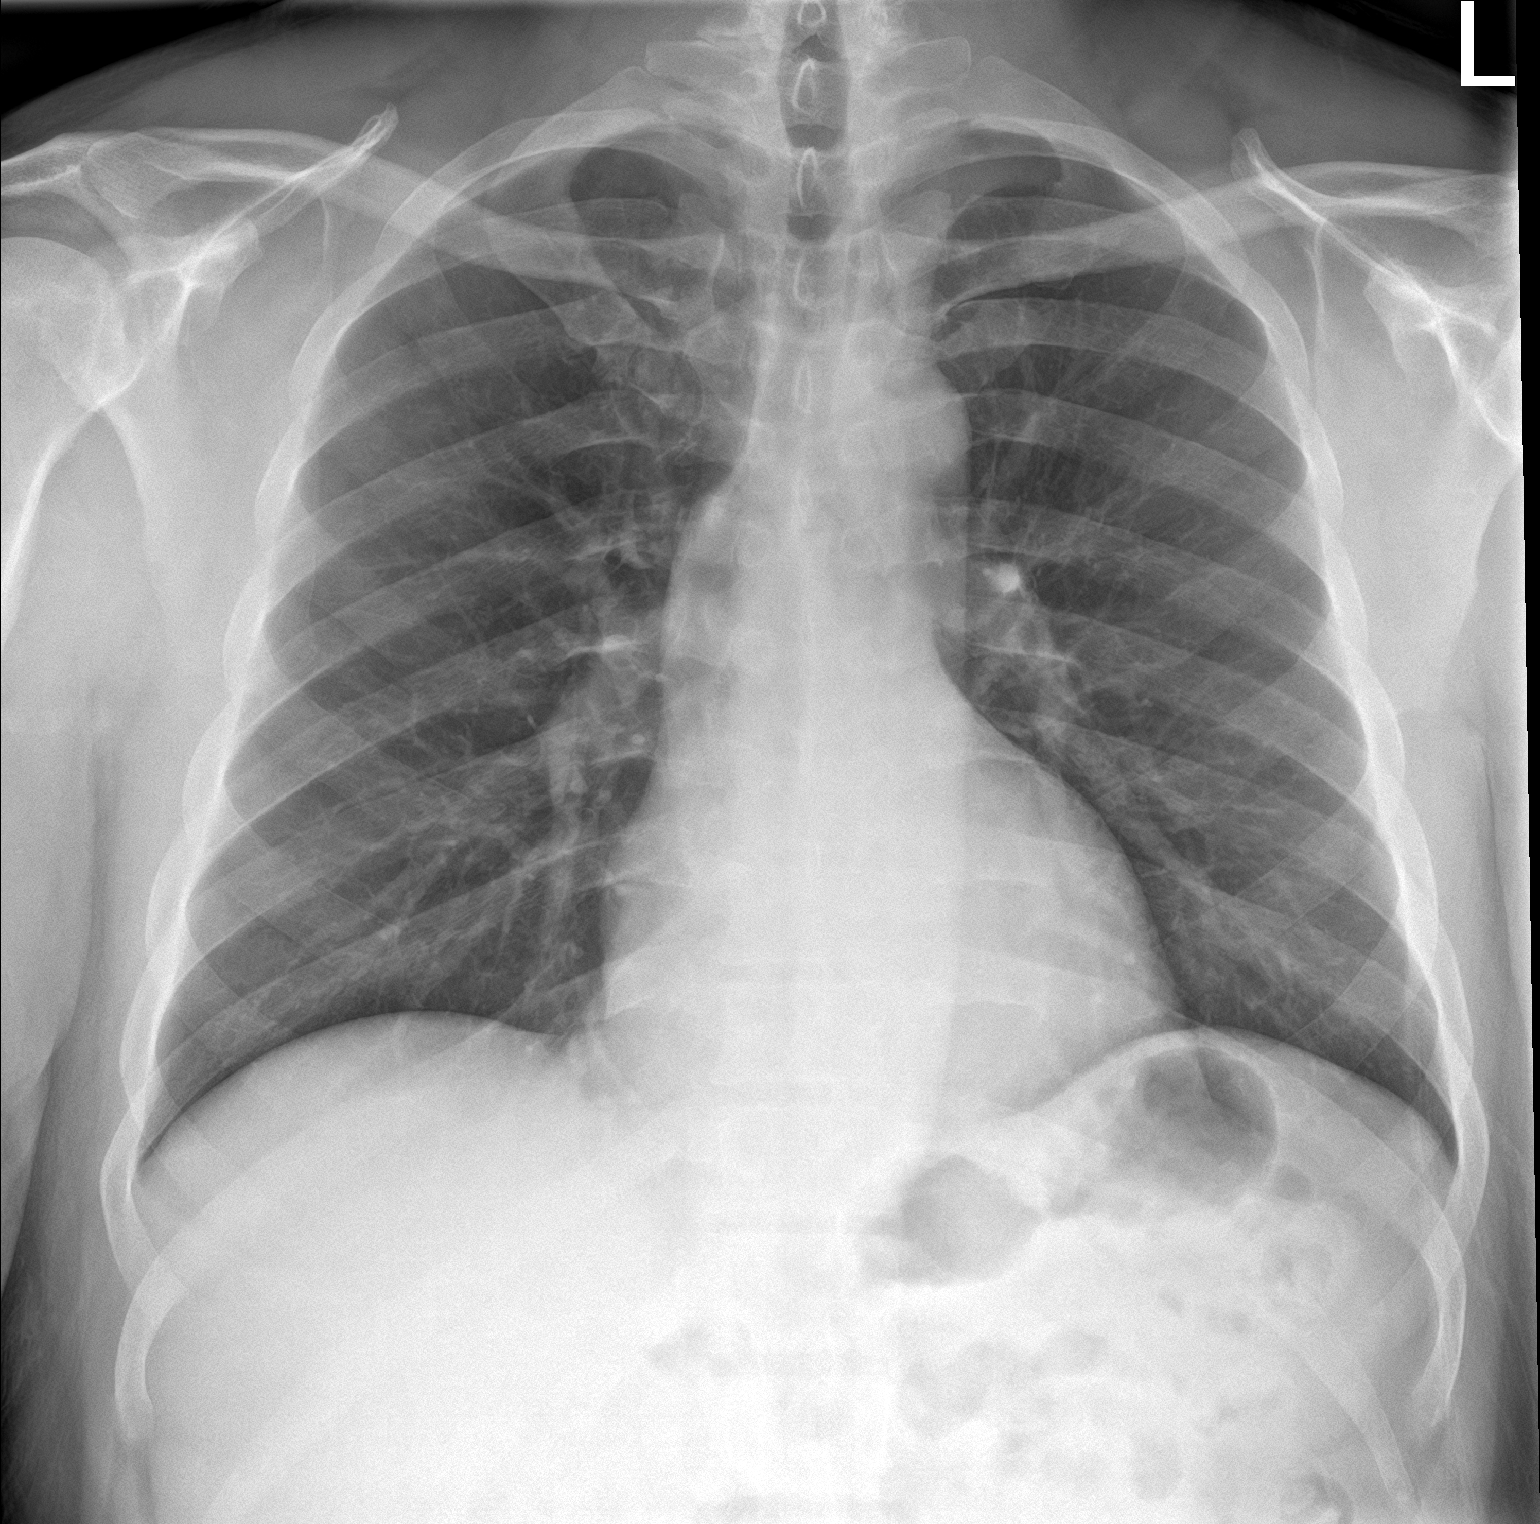

[chest lat]
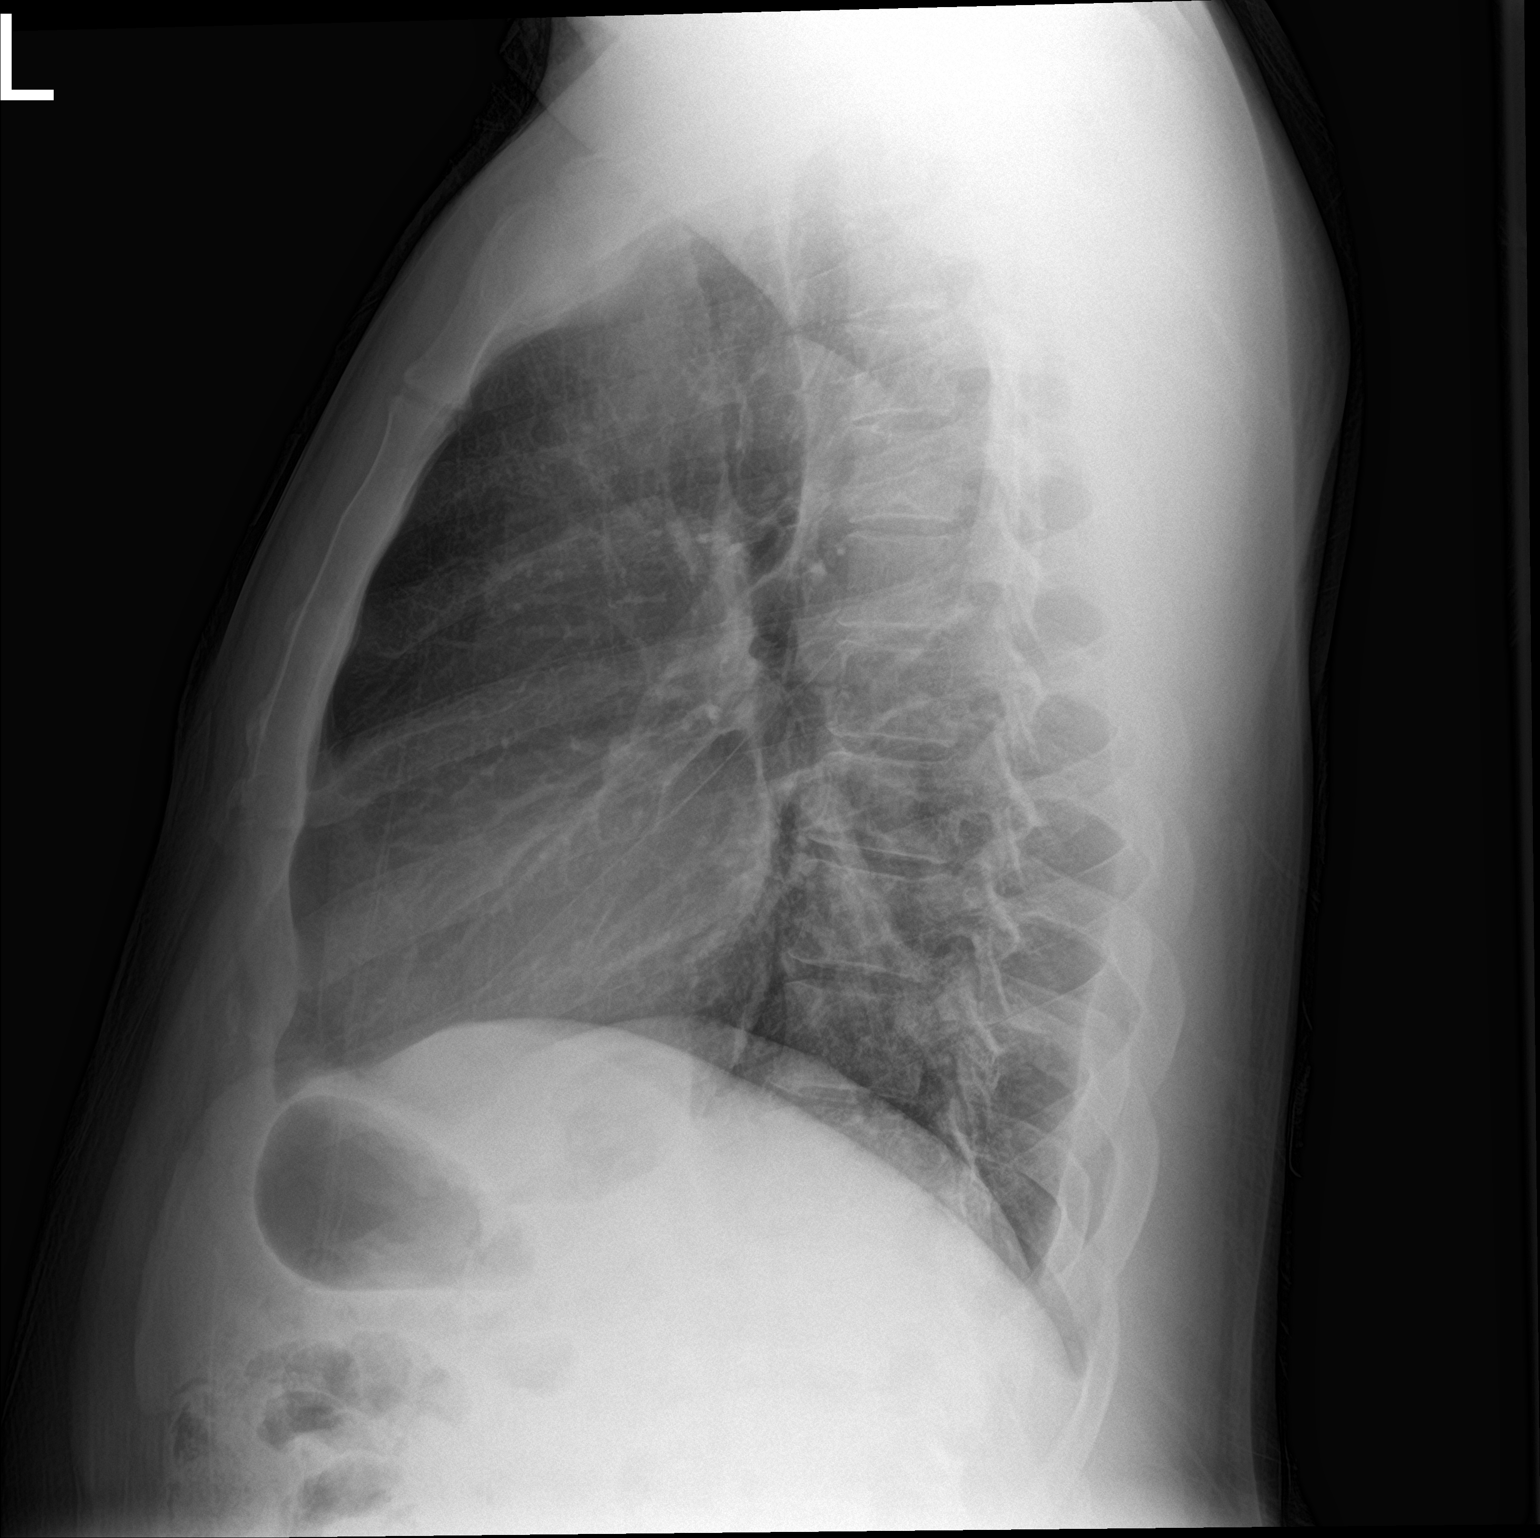

[2 of 2 positions shown; findings below may reference images not displayed]

FINDINGS: The heart size and mediastinal contours are within normal limits.
Both lungs are clear. The visualized skeletal structures are
unremarkable.
IMPRESSION: No active cardiopulmonary disease.

## 2019-03-21 MED ORDER — FAMOTIDINE 20 MG PO TABS: 20.0000 mg | ORAL_TABLET | Freq: Two times a day (BID) | ORAL | 0 refills | Status: DC

## 2019-03-21 NOTE — ED Notes (Signed)
Pt transported to xray 
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
## 2019-06-07 ENCOUNTER — Encounter: Payer: Self-pay | Admitting: Emergency Medicine

## 2019-06-07 ENCOUNTER — Ambulatory Visit
Admission: EM | Admit: 2019-06-07 | Discharge: 2019-06-07 | Disposition: A | Discharge status: Home / Self Care | Payer: Self-pay

## 2019-06-07 ENCOUNTER — Other Ambulatory Visit: Payer: Self-pay

## 2019-06-07 DIAGNOSIS — R059 Cough, unspecified: Secondary | ICD-10-CM

## 2019-06-07 DIAGNOSIS — R05 Cough: Secondary | ICD-10-CM

## 2019-06-07 DIAGNOSIS — R030 Elevated blood-pressure reading, without diagnosis of hypertension: Secondary | ICD-10-CM

## 2019-06-07 DIAGNOSIS — R0789 Other chest pain: Secondary | ICD-10-CM

## 2019-06-07 MED ORDER — ALBUTEROL SULFATE HFA 108 (90 BASE) MCG/ACT IN AERS: 2.0000 | INHALATION_SPRAY | Freq: Four times a day (QID) | RESPIRATORY_TRACT | 0 refills | Status: DC | PRN

## 2019-06-07 NOTE — ED Triage Notes (Signed)
Pt states for the last 2 weeks he has been having a "low grade fever" 99.1-99.4. The last week he has had it run as high as 100.1. he has had cough and tightness in his chest for the past 2 days. He states sleeping under a ceiling fan started it.

## 2019-06-07 NOTE — Discharge Instructions (Addendum)
Recommend start Albuterol inhaler 2 puffs every 6 hours as needed for cough or chest tightness. Continue to use Coricidin or similar sinus medication to help with sinus symptoms. Continue to push fluids to help loosen up any mucus. Rest. Stay at home. Follow-up pending COVID 19 test results or in 4 to 5 days if not improving.

## 2019-06-07 NOTE — ED Provider Notes (Signed)
[facility]    CSN: [id number] Arrival date & time: 06/07/19  1245      History   Chief Complaint Chief Complaint  Patient presents with  . Cough  . Fever    HPI Edward Lozano is a 43 y.o. male.   43 year old male presents with low grade fevers of around 99 off and on for the past 2 weeks. A few times his temp has been around 100 but then will dip down to 94/95. In the past 2 days, he has had some nasal drainage, cough and chest tightness. Denies any GI symptoms. Nasal drainage has improved with the use of Coricidin and other OTC sinus medications but his cough lingers. Does smoke daily. No other family members or friends are ill. Other chronic health issues include left knee arthritis and currently on Mobic daily. Did recently have a cortisone shot in his knee and uncertain if the steroid shot could cause a change in his core temperature.    The history is provided by the patient.    Past Medical History:  Diagnosis Date  . CHF (congestive heart failure) (HCC)     There are no active problems to display for this patient.   Past Surgical History:  Procedure Laterality Date  . NO PAST SURGERIES         Home Medications    Prior to Admission medications   Medication Sig Start Date End Date Taking? Authorizing Provider  meloxicam (MOBIC) 15 MG tablet Take 15 mg by mouth daily. 05/29/19  Yes [provider]  albuterol (VENTOLIN HFA) 108 (90 Base) MCG/ACT inhaler Inhale 2 puffs into the lungs every 6 (six) hours as needed for shortness of breath (or cough). 06/07/19   Sudie GrumblingAmyot, Goldman Birchall Berry, NP  famotidine (PEPCID) 20 MG tablet Take 1 tablet (20 mg total) by mouth 2 (two) times daily. 03/21/19 06/07/19  Willy Eddyobinson, Patrick, MD    Family History Family History  Problem Relation Age of Onset  . Heart failure Mother   . Healthy Father     Social History Social History   Tobacco Use  . Smoking status: Current Every Day Smoker    Types: Cigarettes  .  Smokeless tobacco: Never Used  Substance Use Topics  . Alcohol use: Yes    Comment: occasional  . Drug use: Yes    Types: Marijuana     Allergies   Iodine   Review of Systems Review of Systems  Constitutional: Positive for fever. Negative for activity change, appetite change, chills and fatigue.  HENT: Positive for congestion and postnasal drip. Negative for ear discharge, ear pain, facial swelling, nosebleeds, rhinorrhea, sinus pressure, sinus pain, sneezing, sore throat and trouble swallowing.   Eyes: Negative for pain, discharge, redness and itching.  Respiratory: Positive for cough and chest tightness. Negative for shortness of breath and wheezing.   Gastrointestinal: Negative for diarrhea, nausea and vomiting.  Musculoskeletal: Positive for arthralgias (left knee). Negative for myalgias, neck pain and neck stiffness.  Skin: Negative for color change, rash and wound.  Neurological: Negative for dizziness, tremors, seizures, syncope, weakness, light-headedness, numbness and headaches.  Hematological: Negative for adenopathy. Does not bruise/bleed easily.     Physical Exam Triage Vital Signs ED Triage Vitals  Enc Vitals Group     BP 06/07/19 1322 (!) 149/103     Pulse Rate 06/07/19 1322 77     Resp 06/07/19 1322 18     Temp 06/07/19 1322 98.3 F (36.8 C)  Temp Source 06/07/19 1322 Oral     SpO2 06/07/19 1322 100 %     Weight 06/07/19 1315 240 lb (108.9 kg)     Height 06/07/19 1315 6\' 1"  (1.854 m)     Head Circumference --      Peak Flow --      Pain Score 06/07/19 1315 0     Pain Loc --      Pain Edu? --      Excl. in GC? --    No data found.  Updated Vital Signs BP (!) 149/103 (BP Location: Right Arm)   Pulse 77   Temp 98.3 F (36.8 C) (Oral)   Resp 18   Ht 6\' 1"  (1.854 m)   Wt 240 lb (108.9 kg)   SpO2 100%   BMI 31.66 kg/m   Visual Acuity Right Eye Distance:   Left Eye Distance:   Bilateral Distance:    Right Eye Near:   Left Eye Near:     Bilateral Near:     Physical Exam Vitals signs and nursing note reviewed.  Constitutional:      General: He is awake. He is not in acute distress.    Appearance: Normal appearance. He is well-developed and well-groomed. He is not ill-appearing.     Comments: Patient sitting comfortably on exam table in no acute distress.   HENT:     Head: Normocephalic and atraumatic.     Right Ear: Hearing, tympanic membrane, ear canal and external ear normal.     Left Ear: Hearing, tympanic membrane, ear canal and external ear normal.     Nose: Nose normal. No congestion or rhinorrhea.     Right Sinus: No maxillary sinus tenderness or frontal sinus tenderness.     Left Sinus: No maxillary sinus tenderness or frontal sinus tenderness.     Mouth/Throat:     Lips: Pink.     Mouth: Mucous membranes are moist.     Pharynx: Uvula midline. No pharyngeal swelling, posterior oropharyngeal erythema or uvula swelling.     Comments: Slight clear post nasal drainage present.  Eyes:     Extraocular Movements: Extraocular movements intact.     Conjunctiva/sclera: Conjunctivae normal.  Neck:     Musculoskeletal: Normal range of motion and neck supple. No muscular tenderness.  Cardiovascular:     Rate and Rhythm: Normal rate and regular rhythm.     Pulses: Normal pulses.     Heart sounds: Normal heart sounds. No murmur.  Pulmonary:     Effort: Pulmonary effort is normal. No respiratory distress.     Breath sounds: Normal breath sounds and air entry. No decreased air movement. No decreased breath sounds, wheezing, rhonchi or rales.  Musculoskeletal: Normal range of motion.  Lymphadenopathy:     Cervical: No cervical adenopathy.  Skin:    General: Skin is warm and dry.     Capillary Refill: Capillary refill takes less than 2 seconds.     Findings: No rash.  Neurological:     General: No focal deficit present.     Mental Status: He is alert and oriented to person, place, and time.  Psychiatric:        Mood  and Affect: Mood normal.        Behavior: Behavior normal. Behavior is cooperative.        Thought Content: Thought content normal.        Judgment: Judgment normal.      UC Treatments / Results  Labs (  all labs ordered are listed, but only abnormal results are displayed) Labs Reviewed  NOVEL CORONAVIRUS, NAA (HOSP ORDER, SEND-OUT TO REF LAB; TAT 18-24 HRS)    EKG   Radiology No results found.  Procedures Procedures (including critical care time)  Medications Ordered in UC Medications - No data to display  Initial Impression / Assessment and Plan / UC Course  I have reviewed the triage vital signs and the nursing notes.  Pertinent labs & imaging results that were available during my care of the patient were reviewed by me and considered in my medical decision making (see chart for details).    Discussed with patient that he may have a mild viral illness. Temperatures of 99 are not concerning at this point. Continue to monitor. Discussed that steroids can affect immune function and may affect body temperature but doubt reason for sporadic body temperature changes. Recommend start Albuterol 2 puffs every 6 hours as needed for cough or chest tightness. Encouraged not to smoke. May continue Coricidin or similar sinus medication to help with sinus drainage. Continue to increase fluids/water to help loosen up any mucus in chest. Rest. Stay at home. Blood pressure was elevated today- continue to monitor. Follow-up pending COVID 19 test results and in 4 to 5 days if not improving.   Final Clinical Impressions(s) / UC Diagnoses   Final diagnoses:  Cough  Tightness in chest  Elevated blood-pressure reading without diagnosis of hypertension     Discharge Instructions     Recommend start Albuterol inhaler 2 puffs every 6 hours as needed for cough or chest tightness. Continue to use Coricidin or similar sinus medication to help with sinus symptoms. Continue to push fluids to help  loosen up any mucus. Rest. Stay at home. Follow-up pending COVID 19 test results or in 4 to 5 days if not improving.     ED Prescriptions    Medication Sig Dispense Auth. Provider   albuterol (VENTOLIN HFA) 108 (90 Base) MCG/ACT inhaler Inhale 2 puffs into the lungs every 6 (six) hours as needed for shortness of breath (or cough). 18 g Katy Apo, NP     PDMP not reviewed this encounter.   Katy Apo, NP 06/07/19 2017

## 2019-06-08 LAB — NOVEL CORONAVIRUS, NAA (HOSP ORDER, SEND-OUT TO REF LAB; TAT 18-24 HRS): SARS-CoV-2, NAA: NOT DETECTED

## 2019-08-05 ENCOUNTER — Emergency Department: Payer: Self-pay

## 2019-08-05 ENCOUNTER — Emergency Department
Admission: EM | Admit: 2019-08-05 | Discharge: 2019-08-05 | Disposition: A | Discharge status: Home / Self Care | Payer: Self-pay | Attending: Emergency Medicine | Admitting: Emergency Medicine

## 2019-08-05 ENCOUNTER — Other Ambulatory Visit: Payer: Self-pay

## 2019-08-05 ENCOUNTER — Encounter: Payer: Self-pay | Admitting: Medical Oncology

## 2019-08-05 DIAGNOSIS — F17210 Nicotine dependence, cigarettes, uncomplicated: Secondary | ICD-10-CM | POA: Insufficient documentation

## 2019-08-05 DIAGNOSIS — R519 Headache, unspecified: Secondary | ICD-10-CM | POA: Insufficient documentation

## 2019-08-05 DIAGNOSIS — Z79899 Other long term (current) drug therapy: Secondary | ICD-10-CM | POA: Insufficient documentation

## 2019-08-05 DIAGNOSIS — I509 Heart failure, unspecified: Secondary | ICD-10-CM | POA: Insufficient documentation

## 2019-08-05 DIAGNOSIS — I10 Essential (primary) hypertension: Secondary | ICD-10-CM | POA: Insufficient documentation

## 2019-08-05 LAB — CBC
HCT: 43.1 % (ref 39.0–52.0)
Hemoglobin: 14.4 g/dL (ref 13.0–17.0)
MCH: 30.3 pg (ref 26.0–34.0)
MCHC: 33.4 g/dL (ref 30.0–36.0)
MCV: 90.5 fL (ref 80.0–100.0)
Platelets: 236 10*3/uL (ref 150–400)
RBC: 4.76 MIL/uL (ref 4.22–5.81)
RDW: 13.2 % (ref 11.5–15.5)
WBC: 8 10*3/uL (ref 4.0–10.5)
nRBC: 0 % (ref 0.0–0.2)

## 2019-08-05 LAB — BASIC METABOLIC PANEL
Anion gap: 6 (ref 5–15)
BUN: 16 mg/dL (ref 6–20)
CO2: 25 mmol/L (ref 22–32)
Calcium: 8.8 mg/dL — ABNORMAL LOW (ref 8.9–10.3)
Chloride: 108 mmol/L (ref 98–111)
Creatinine, Ser: 0.83 mg/dL (ref 0.61–1.24)
GFR calc Af Amer: >60 mL/min (ref >60)
GFR calc non Af Amer: >60 mL/min (ref >60)
Glucose, Bld: 100 mg/dL — ABNORMAL HIGH (ref 70–99)
Potassium: 3.4 mmol/L — ABNORMAL LOW (ref 3.5–5.1)
Sodium: 139 mmol/L (ref 135–145)

## 2019-08-05 LAB — TROPONIN I (HIGH SENSITIVITY)
Troponin I (High Sensitivity): 7 ng/L (ref <18)
Troponin I (High Sensitivity): 6 ng/L (ref <18)
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
## 2019-08-05 MED ORDER — AMLODIPINE BESYLATE 5 MG PO TABS: 5.0000 mg | ORAL_TABLET | Freq: Every day | ORAL | 1 refills | Status: DC

## 2019-08-05 NOTE — ED Notes (Signed)
Pt denies any visual disturbances, flashes, spots, auras, or double vision

## 2019-08-05 NOTE — ED Provider Notes (Signed)
[facility] Emergency Department Provider Note ________   First MD Initiated Contact with Patient 08/05/19 [phone]     (approximate)  I have reviewed the triage vital signs and the nursing notes.   HISTORY  Chief Complaint Hypertension and Headache    HPI Edward Lozano is a 43 y.o. male with below list of previous medical conditions presents to the emergency department secondary to noted hypertension at home.  Patient does bring in a log revealing blood pressures systolically 160s being the highest.  Patient significant other at bedside states that she gave him 10 mg of Norvasc and that his blood pressure has not "gotten better".  Patient's current blood pressure 160/99.  Patient denies any chest pain no shortness of breath.        Past Medical History:  Diagnosis Date  . CHF (congestive heart failure) (HCC)     There are no active problems to display for this patient.   Past Surgical History:  Procedure Laterality Date  . NO PAST SURGERIES      Prior to Admission medications   Medication Sig Start Date End Date Taking? Authorizing Provider  albuterol (VENTOLIN HFA) 108 (90 Base) MCG/ACT inhaler Inhale 2 puffs into the lungs every 6 (six) hours as needed for shortness of breath (or cough). 06/07/19   Sudie Grumbling, NP  amLODipine (NORVASC) 5 MG tablet Take 1 tablet (5 mg total) by mouth daily. 08/05/19 08/04/20  Darci Current, MD  meloxicam (MOBIC) 15 MG tablet Take 15 mg by mouth daily. 05/29/19   [provider]  famotidine (PEPCID) 20 MG tablet Take 1 tablet (20 mg total) by mouth 2 (two) times daily. 03/21/19 06/07/19  Willy Eddy, MD    Allergies Iodine  Family History  Problem Relation Age of Onset  . Heart failure Mother   . Healthy Father     Social History Social History   Tobacco Use  . Smoking status: Current Every Day Smoker    Types: Cigarettes  . Smokeless tobacco: Never Used  Substance Use Topics  .  Alcohol use: Yes    Comment: occasional  . Drug use: Yes    Types: Marijuana    Review of Systems Constitutional: No fever/chills Eyes: No visual changes. ENT: No sore throat. Cardiovascular: Denies chest pain. Respiratory: Denies shortness of breath. Gastrointestinal: No abdominal pain.  No nausea, no vomiting.  No diarrhea.  No constipation. Genitourinary: Negative for dysuria. Musculoskeletal: Negative for neck pain.  Negative for back pain. Integumentary: Negative for rash. Neurological: Negative for headaches, focal weakness or numbness.  ____________________________________________   PHYSICAL EXAM:  VITAL SIGNS: ED Triage Vitals [08/05/19 0503]  Enc Vitals Group     BP (!) 160/99     Pulse Rate 73     Resp 16     Temp 98.4 F (36.9 C)     Temp Source Oral     SpO2 97 %     Weight 105.2 kg (232 lb)     Height 1.854 m (6\' 1" )     Head Circumference      Peak Flow      Pain Score 10     Pain Loc      Pain Edu?      Excl. in GC?      Constitutional: Alert and oriented.  Eyes: Conjunctivae are normal.  Mouth/Throat: Patient is wearing a mask. Neck: No stridor.  No meningeal signs.   Cardiovascular: Normal rate, regular rhythm.  Good peripheral circulation. Grossly normal heart sounds. Respiratory: Normal respiratory effort.  No retractions. Gastrointestinal: Soft and nontender. No distention.  Musculoskeletal: No lower extremity tenderness nor edema. No gross deformities of extremities. Neurologic:  Normal speech and language. No gross focal neurologic deficits are appreciated.  Skin:  Skin is warm, dry and intact. Psychiatric: Mood and affect are normal. Speech and behavior are normal.  ____________________________________________   LABS (all labs ordered are listed, but only abnormal results are displayed)  Labs Reviewed  BASIC METABOLIC PANEL - Abnormal; Notable for the following components:      Result Value   Potassium 3.4 (*)    Glucose, Bld 100  (*)    Calcium 8.8 (*)    All other components within normal limits  CBC  TROPONIN I (HIGH SENSITIVITY)  TROPONIN I (HIGH SENSITIVITY)   ____________________________________________  EKG  ED ECG REPORT I, Yalobusha N Aavya Shafer, the attending physician, personally viewed and interpreted this ECG.   Date: 08/05/2019  EKG Time: 5:04 AM  Rate: 71  Rhythm: Normal Sinus Rhythm  Axis: Normal  Intervals:Normal  ST&T Change: None  ____________________________________________  RADIOLOGY I,  N Fynley Chrystal, personally viewed and evaluated these images (plain radiographs) as part of my medical decision making, as well as reviewing the written report by the radiologist.  ED MD interpretation: No active cardiopulmonary disease noted on chest x-ray  Official radiology report(s): Dg Chest 2 View  Result Date: 08/05/2019 CLINICAL DATA:  Chest pain, injury. EXAM: CHEST - 2 VIEW COMPARISON:  Chest x-ray dated 03/21/2019. FINDINGS: Heart size and mediastinal contours are within normal limits. Lungs are clear. No pleural effusion or pneumothorax is seen. Osseous structures about the chest are unremarkable. No rib fracture or displacement identified. IMPRESSION: No active cardiopulmonary disease. No evidence of pneumonia or pulmonary edema. No rib fracture or dislocation seen. Electronically Signed   By: Franki Cabot M.D.   On: 08/05/2019 06:06      Procedures   ____________________________________________   INITIAL IMPRESSION / MDM / [place] / ED COURSE  As part of my medical decision making, I reviewed the following data within the electronic MEDICAL RECORD NUMBER  43 year old male presented with above-stated history and physical exam secondary to asymptomatic hypertension.  Given patient's noted documentation of multiple elevated blood pressures.  Will prescribe Norvasc for the patient at home.  Patient advised to follow-up with primary care provider.   ____________________________________________  FINAL CLINICAL IMPRESSION(S) / ED DIAGNOSES  Final diagnoses:  Hypertension, unspecified type     MEDICATIONS GIVEN DURING THIS VISIT:  Medications - No data to display   ED Discharge Orders         Ordered    amLODipine (NORVASC) 5 MG tablet  Daily     08/05/19 3086          *Please note:  Edward Lozano was evaluated in Emergency Department on 08/05/2019 for the symptoms described in the history of present illness. He was evaluated in the context of the global COVID-19 pandemic, which necessitated consideration that the patient might be at risk for infection with the SARS-CoV-2 virus that causes COVID-19. Institutional protocols and algorithms that pertain to the evaluation of patients at risk for COVID-19 are in a state of rapid change based on information released by regulatory bodies including the CDC and federal and state organizations. These policies and algorithms were followed during the patient's care in the ED.  Some ED evaluations and interventions may be delayed as a result  of limited staffing during the pandemic.*  Note:  This document was prepared using Dragon voice recognition software and may include unintentional dictation errors.   Darci CurrentBrown, [place] N, MD 08/05/19 [phone]

## 2019-08-05 NOTE — ED Notes (Signed)
Pt reports taking 10mg  norvasc at 0300 this am

## 2019-08-05 NOTE — ED Triage Notes (Signed)
Pt reports for the past 3-4 days he has been having headaches so he began checking his BP at home and noted it to be 170's. Pt states that he fell a few days ago too and injured the left side of his chest and has been having pain to ribs and shoulder.

## 2019-11-27 ENCOUNTER — Other Ambulatory Visit: Payer: Self-pay

## 2019-11-27 ENCOUNTER — Ambulatory Visit
Admission: EM | Admit: 2019-11-27 | Discharge: 2019-11-27 | Disposition: A | Discharge status: Home / Self Care | Payer: Self-pay | Attending: Family Medicine | Admitting: Family Medicine

## 2019-11-27 ENCOUNTER — Ambulatory Visit (INDEPENDENT_AMBULATORY_CARE_PROVIDER_SITE_OTHER): Payer: Self-pay

## 2019-11-27 DIAGNOSIS — M940 Chondrocostal junction syndrome [Tietze]: Secondary | ICD-10-CM | POA: Insufficient documentation

## 2019-11-27 DIAGNOSIS — R0789 Other chest pain: Secondary | ICD-10-CM

## 2019-11-27 DIAGNOSIS — R0602 Shortness of breath: Secondary | ICD-10-CM

## 2019-11-27 IMAGING — CR DG CHEST 2V
2 series · 2 of 2 positions shown · non-contrast
Comparison: [DATE]

CLINICAL DATA: Shortness of breath, chest pain

EXAM:
CHEST - 2 VIEW

[chest pa]
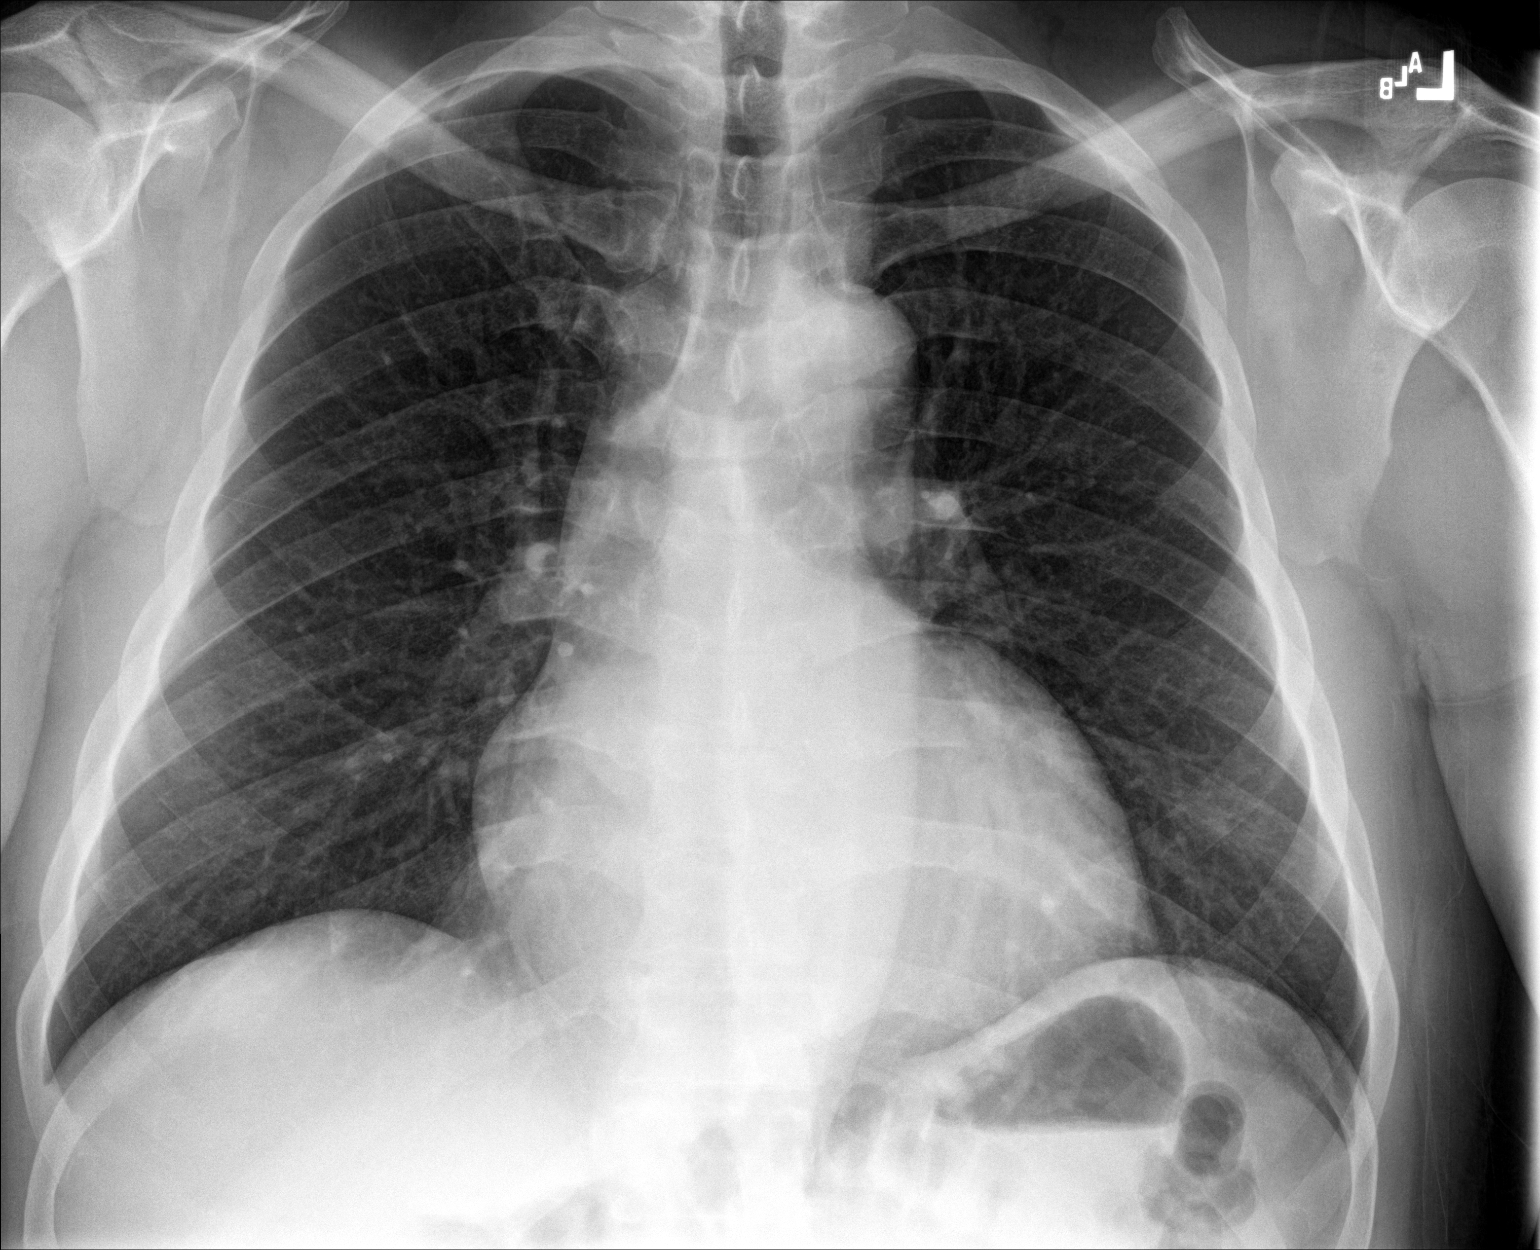

[chest lat]
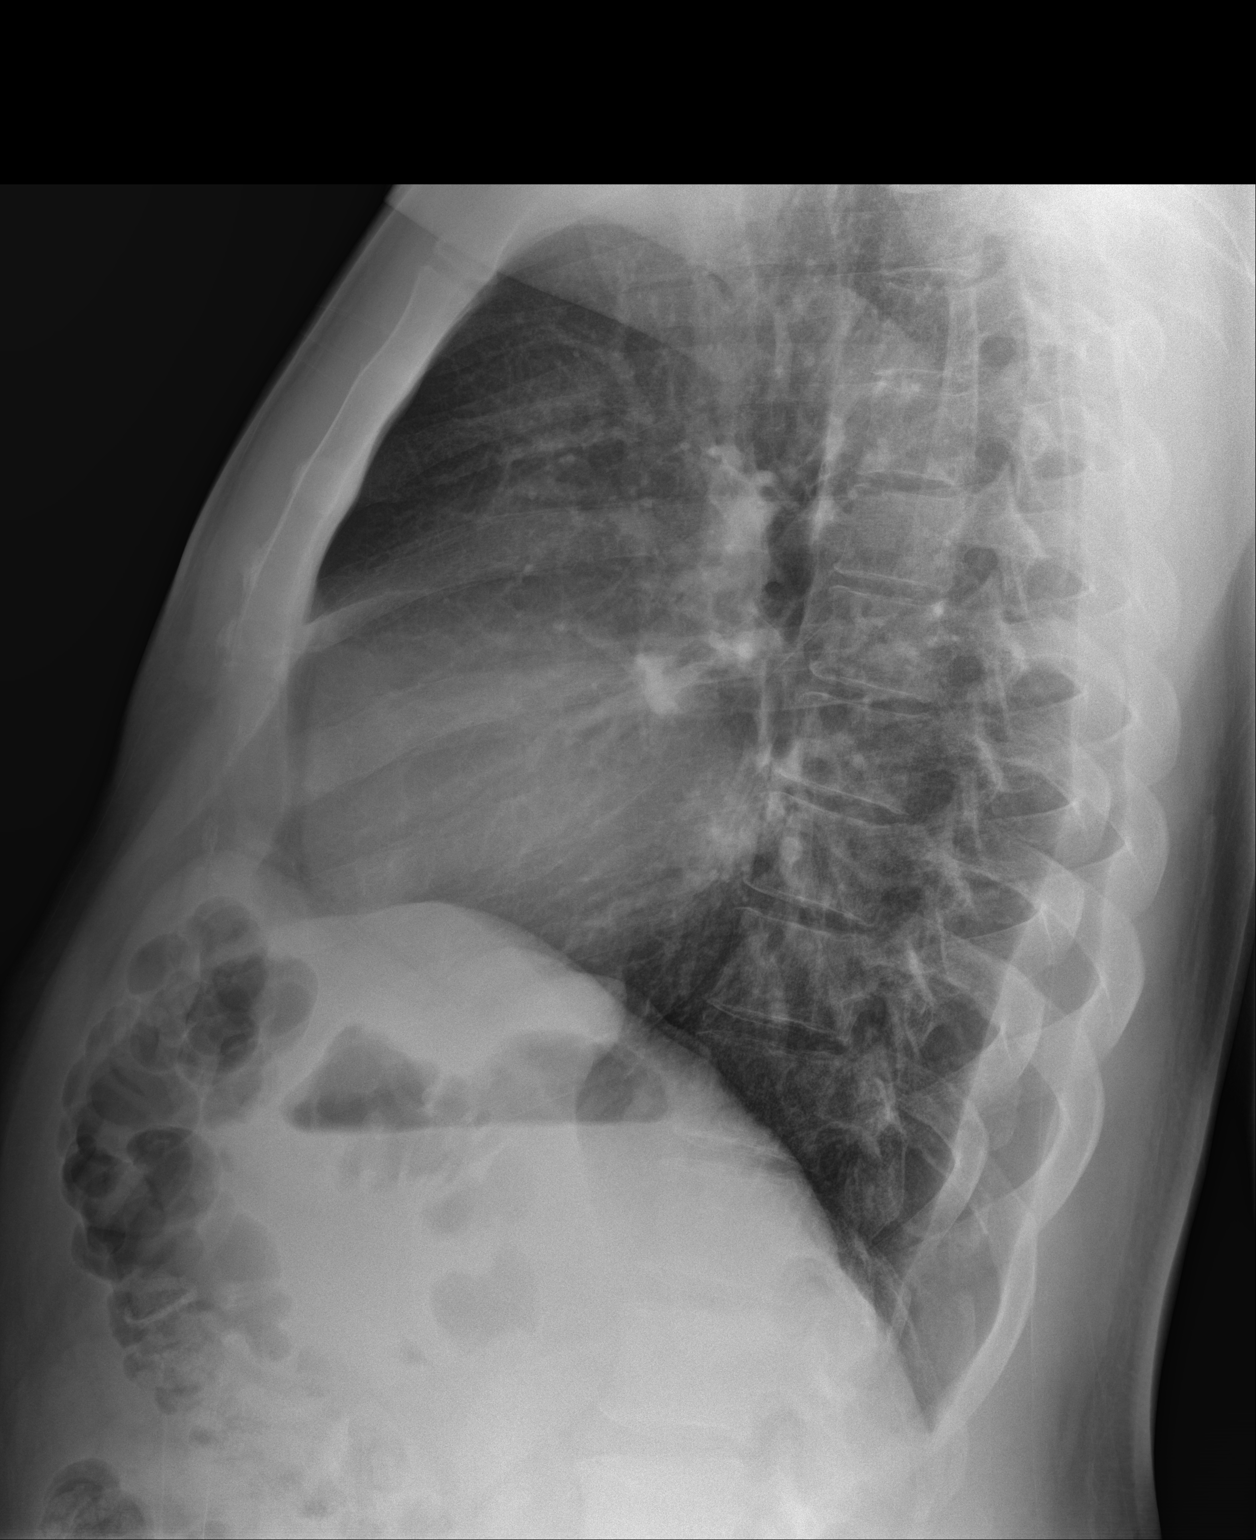

[2 of 2 positions shown; findings below may reference images not displayed]

FINDINGS: Heart and mediastinal contours are within normal limits. No focal
opacities or effusions. No acute bony abnormality.
IMPRESSION: No active cardiopulmonary disease.

## 2019-11-27 NOTE — ED Provider Notes (Signed)
[facility]    CSN: [id number] Arrival date & time: 11/27/19  1937      History   Chief Complaint Chief Complaint  Patient presents with  . Chest Pain    HPI Edward Lozano is a 44 y.o. male.   44 yo male with a c/o chest pain in the middle of his chest that started about an hour ago after a hard coughing spell. States pain is worse with movement and deep breaths. Feels slightly short of breath. Denies productive cough. Patient is a smoker.    Chest Pain   Past Medical History:  Diagnosis Date  . CHF (congestive heart failure) (HCC)     There are no problems to display for this patient.   Past Surgical History:  Procedure Laterality Date  . NO PAST SURGERIES         Home Medications    Prior to Admission medications   Medication Sig Start Date End Date Taking? Authorizing Provider  amLODipine (NORVASC) 5 MG tablet Take 1 tablet (5 mg total) by mouth daily. 08/05/19 08/04/20 Yes Darci Current, MD  meloxicam (MOBIC) 15 MG tablet Take 15 mg by mouth daily. 05/29/19  Yes [provider]  albuterol (VENTOLIN HFA) 108 (90 Base) MCG/ACT inhaler Inhale 2 puffs into the lungs every 6 (six) hours as needed for shortness of breath (or cough). 06/07/19   Sudie Grumbling, NP  famotidine (PEPCID) 20 MG tablet Take 1 tablet (20 mg total) by mouth 2 (two) times daily. 03/21/19 06/07/19  Willy Eddy, MD    Family History Family History  Problem Relation Age of Onset  . Heart failure Mother   . Healthy Father     Social History Social History   Tobacco Use  . Smoking status: Former Smoker    Types: Cigarettes  . Smokeless tobacco: Never Used  Substance Use Topics  . Alcohol use: Yes    Comment: occasional  . Drug use: Not Currently    Types: Marijuana     Allergies   Iodine   Review of Systems Review of Systems  Cardiovascular: Positive for chest pain.     Physical Exam Triage Vital Signs ED Triage Vitals  Enc Vitals Group      BP 11/27/19 1951 (!) 147/104     Pulse Rate 11/27/19 1951 77     Resp 11/27/19 1951 18     Temp 11/27/19 1951 98 F (36.7 C)     Temp Source 11/27/19 1951 Oral     SpO2 11/27/19 1951 100 %     Weight 11/27/19 1950 245 lb (111.1 kg)     Height 11/27/19 1950 6\' 1"  (1.854 m)     Head Circumference --      Peak Flow --      Pain Score 11/27/19 1949 8     Pain Loc --      Pain Edu? --      Excl. in GC? --    No data found.  Updated Vital Signs BP (!) 147/104 (BP Location: Left Arm) Comment: hasnt taken meds in past few days  Pulse 77   Temp 98 F (36.7 C) (Oral)   Resp 18   Ht 6\' 1"  (1.854 m)   Wt 111.1 kg   SpO2 100%   BMI 32.32 kg/m   Visual Acuity Right Eye Distance:   Left Eye Distance:   Bilateral Distance:    Right Eye Near:   Left Eye Near:  Bilateral Near:     Physical Exam Vitals and nursing note reviewed.  Constitutional:      General: He is not in acute distress.    Appearance: He is not diaphoretic.  Cardiovascular:     Rate and Rhythm: Normal rate.     Pulses: Normal pulses.     Heart sounds: Normal heart sounds.  Pulmonary:     Effort: Pulmonary effort is normal. No respiratory distress.     Breath sounds: Normal breath sounds. No stridor. No wheezing, rhonchi or rales.  Chest:     Chest wall: Tenderness (reproducible sternum) present.  Musculoskeletal:     Right lower leg: No edema.     Left lower leg: No edema.  Neurological:     Mental Status: He is alert.      UC Treatments / Results  Labs (all labs ordered are listed, but only abnormal results are displayed) Labs Reviewed - No data to display  EKG   Radiology DG Chest 2 View  Result Date: 11/27/2019 CLINICAL DATA:  Shortness of breath, chest pain EXAM: CHEST - 2 VIEW COMPARISON:  08/05/2019 FINDINGS: Heart and mediastinal contours are within normal limits. No focal opacities or effusions. No acute bony abnormality. IMPRESSION: No active cardiopulmonary disease.  Electronically Signed   By: Rolm Baptise M.D.   On: 11/27/2019 20:19    Procedures ED EKG  Date/Time: 11/27/2019 8:19 PM Performed by: Norval Gable, MD Authorized by: Norval Gable, MD   ECG reviewed by ED Physician in the absence of a cardiologist: yes   Previous ECG:    Previous ECG:  Compared to current   Similarity:  No change Interpretation:    Interpretation: normal   Rate:    ECG rate:  71   ECG rate assessment: normal   Rhythm:    Rhythm: sinus rhythm   Ectopy:    Ectopy: none   QRS:    QRS axis:  Normal   QRS intervals:  Normal Conduction:    Conduction: normal   ST segments:    ST segments:  Normal T waves:    T waves: normal     (including critical care time)  Medications Ordered in UC Medications - No data to display  Initial Impression / Assessment and Plan / UC Course  I have reviewed the triage vital signs and the nursing notes.  Pertinent labs & imaging results that were available during my care of the patient were reviewed by me and considered in my medical decision making (see chart for details).      Final Clinical Impressions(s) / UC Diagnoses   Final diagnoses:  Costochondritis    ED Prescriptions    None      1.ekg/x-ray results and diagnosis reviewed with patient 2. Recommend supportive treatment rest, ice, over the counter analgesics prn 3. Follow-up prn if symptoms worsen or don't improve   PDMP not reviewed this encounter.   Norval Gable, MD 11/27/19 2026

## 2019-11-27 NOTE — Discharge Instructions (Signed)
Rest, ice, over the counter tylenol as needed 

## 2019-11-27 NOTE — ED Triage Notes (Signed)
Patient complains of chest pain that started around 45 mins to 1 hour ago. Patient states that pain is in the center of his chest and feels short of breath. Patient states that this started after he coughed hard. States that he has a history of CHF.

## 2020-05-09 ENCOUNTER — Ambulatory Visit (INDEPENDENT_AMBULATORY_CARE_PROVIDER_SITE_OTHER): Payer: BC Managed Care – PPO

## 2020-05-09 ENCOUNTER — Ambulatory Visit: Payer: BC Managed Care – PPO

## 2020-05-09 ENCOUNTER — Ambulatory Visit: Payer: Self-pay

## 2020-05-09 ENCOUNTER — Ambulatory Visit
Admission: EM | Admit: 2020-05-09 | Discharge: 2020-05-09 | Disposition: A | Discharge status: Home / Self Care | Payer: BC Managed Care – PPO | Attending: Internal Medicine | Admitting: Internal Medicine

## 2020-05-09 ENCOUNTER — Other Ambulatory Visit: Payer: Self-pay

## 2020-05-09 DIAGNOSIS — M25562 Pain in left knee: Secondary | ICD-10-CM | POA: Diagnosis not present

## 2020-05-09 DIAGNOSIS — M79652 Pain in left thigh: Secondary | ICD-10-CM

## 2020-05-09 DIAGNOSIS — M79651 Pain in right thigh: Secondary | ICD-10-CM

## 2020-05-09 DIAGNOSIS — M25552 Pain in left hip: Secondary | ICD-10-CM

## 2020-05-09 DIAGNOSIS — H60331 Swimmer's ear, right ear: Secondary | ICD-10-CM

## 2020-05-09 IMAGING — CR DG HIP (WITH OR WITHOUT PELVIS) 2-3V*L*
4 series · 4 of 4 positions shown · non-contrast
Comparison: None.

CLINICAL DATA: Left hip pain

EXAM:
DG HIP (WITH OR WITHOUT PELVIS) 2-3V LEFT

[pelvis ap]
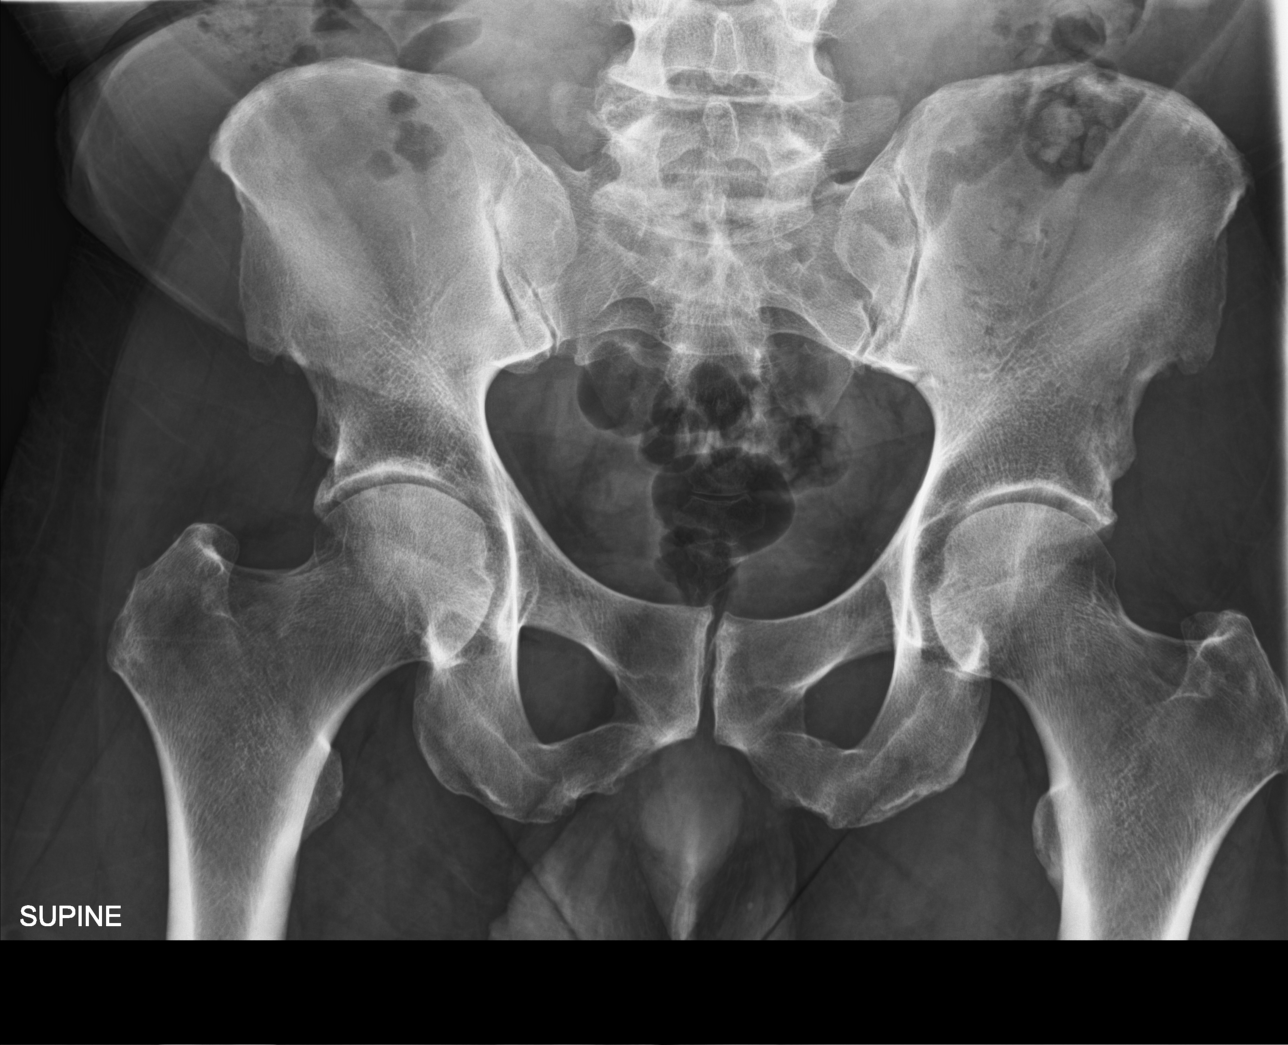

[hip ap]
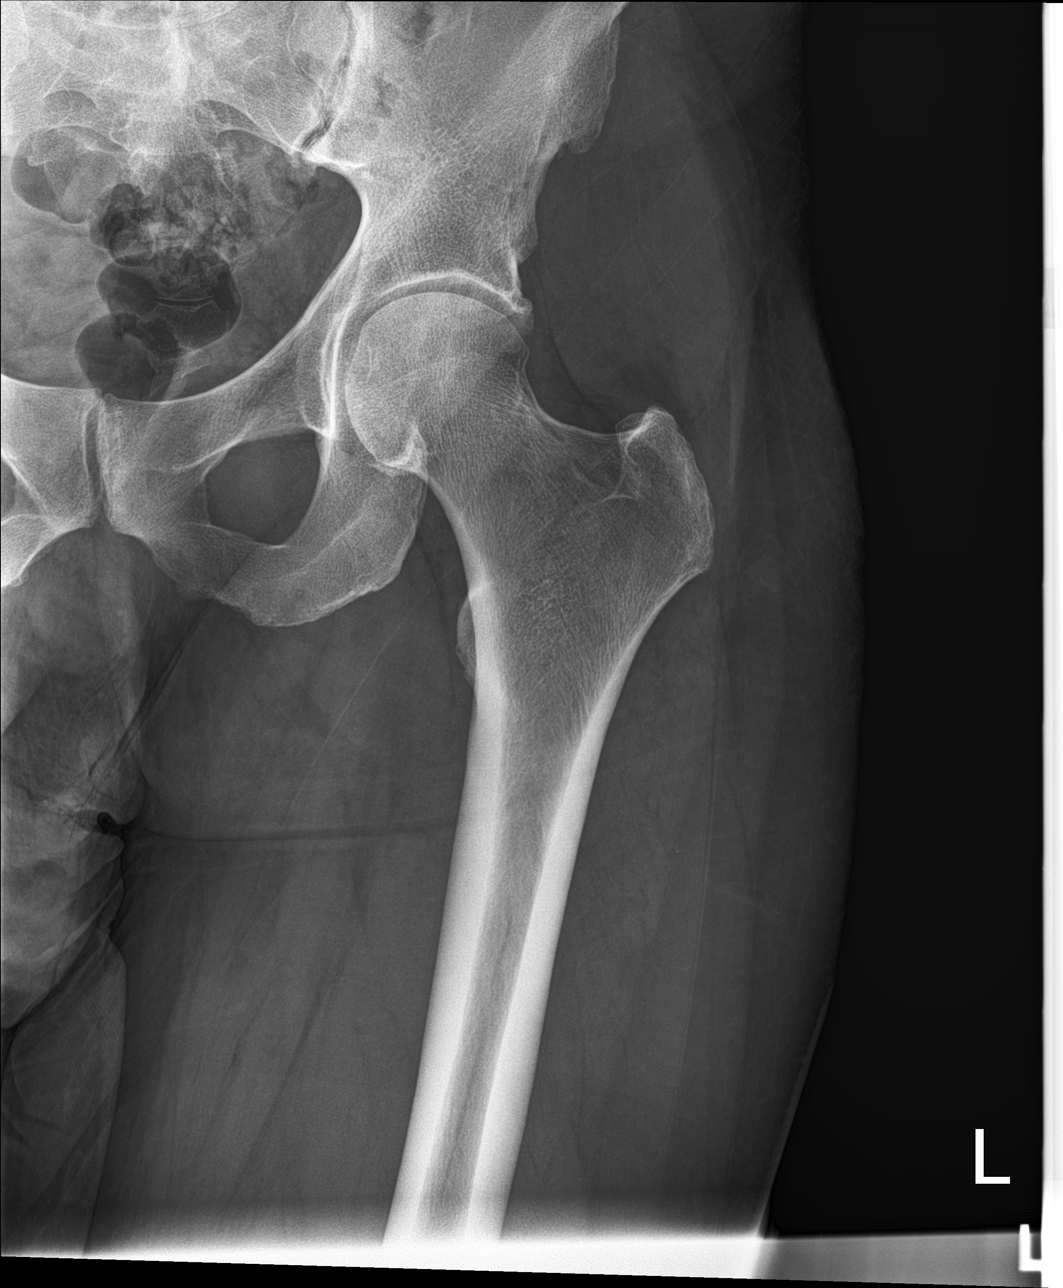

[hip lat (1 of 2)]
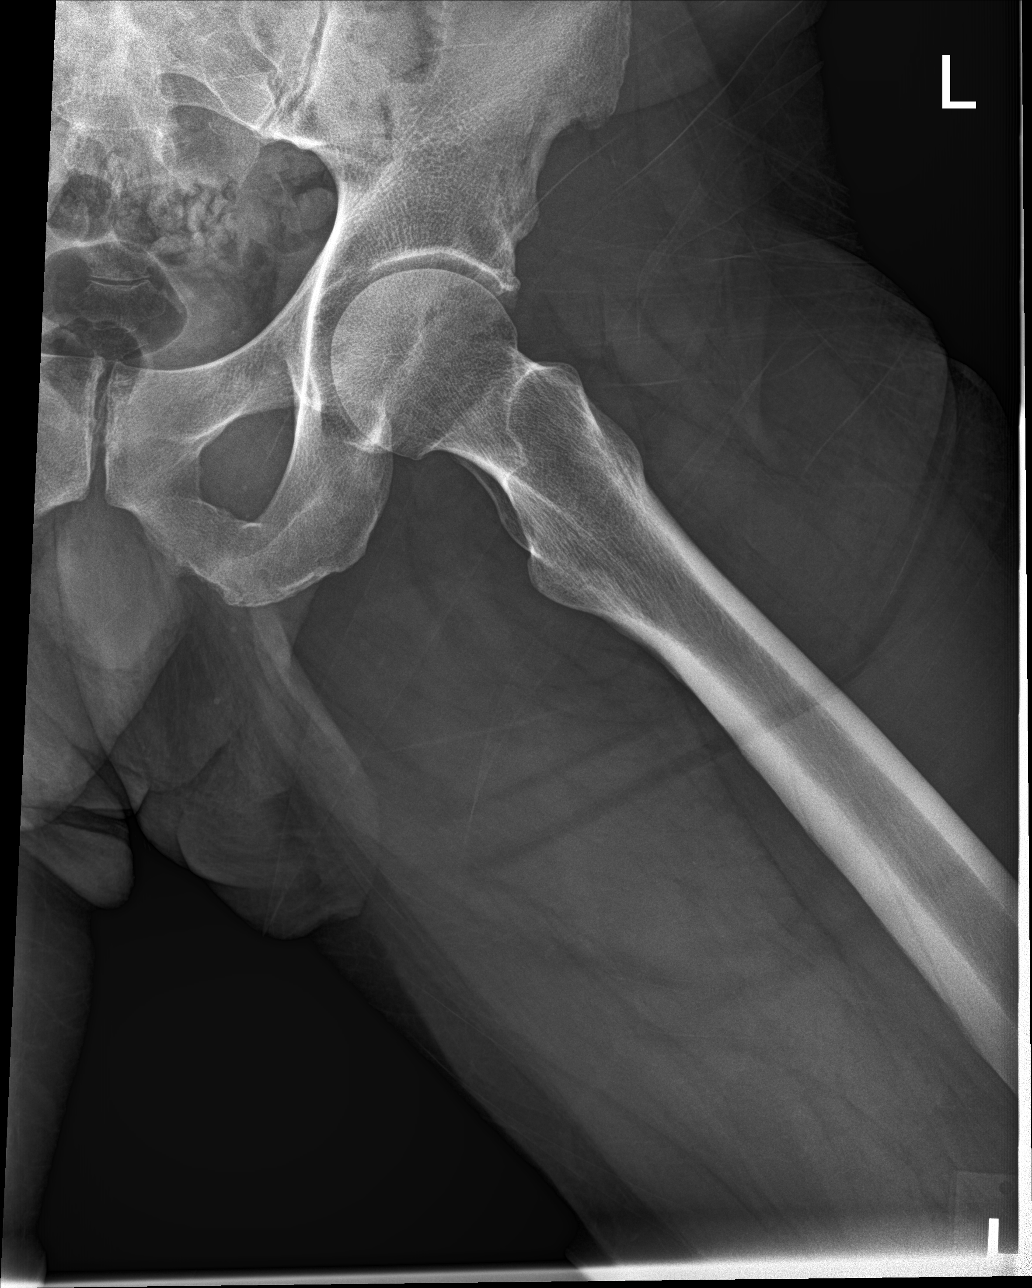

[hip lat (2 of 2)]
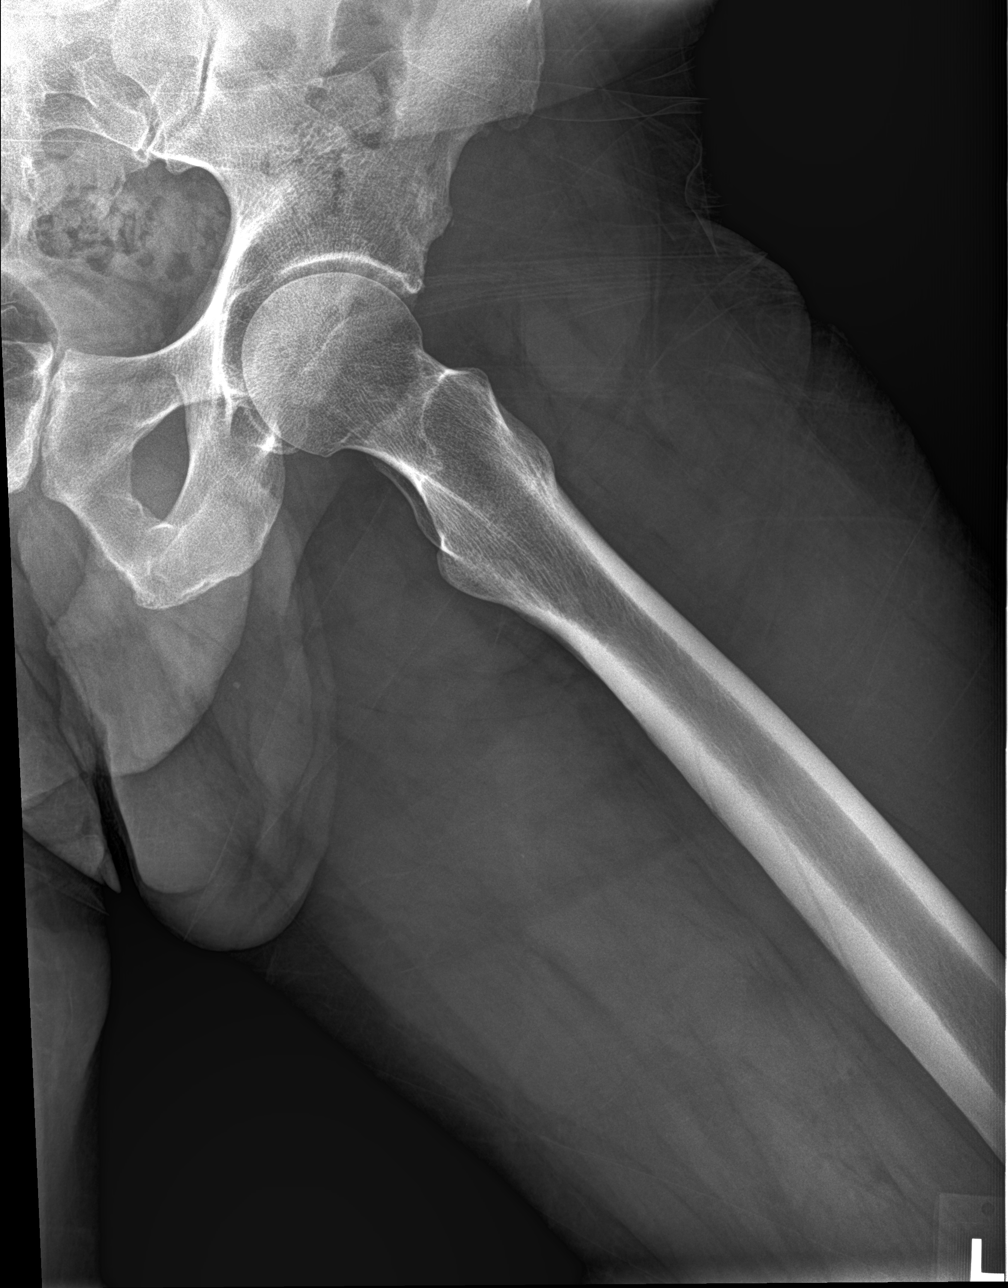

[4 of 4 positions shown; findings below may reference images not displayed]

FINDINGS: Normal left hip. No joint space narrowing, degenerative change, or
fracture. Negative for AVN

Mild degenerative change in the right hip. No pelvic fracture or
bone lesion identified. SI joints intact.
IMPRESSION: Negative left hip.  Mild degenerative change right hip.

## 2020-05-09 MED ORDER — NABUMETONE 750 MG PO TABS: 750.0000 mg | ORAL_TABLET | Freq: Two times a day (BID) | ORAL | 0 refills | Status: DC

## 2020-05-09 MED ORDER — KETOROLAC TROMETHAMINE 60 MG/2ML IM SOLN
60.0000 mg | Freq: Once | INTRAMUSCULAR | Status: AC
  Administered 2020-05-09: 60 mg via INTRAMUSCULAR

## 2020-05-09 MED ORDER — NEOMYCIN-POLYMYXIN-HC 3.5-10000-1 OT SUSP: 3.0000 [drp] | Freq: Three times a day (TID) | OTIC | 0 refills | Status: AC

## 2020-05-09 NOTE — Discharge Instructions (Addendum)
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  Please follow up with orthopedic or your family Dr if you dont improve in 7 days
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
## 2020-05-27 ENCOUNTER — Other Ambulatory Visit: Payer: Self-pay | Admitting: Orthopedic Surgery

## 2020-05-27 DIAGNOSIS — M25551 Pain in right hip: Secondary | ICD-10-CM

## 2020-06-22 ENCOUNTER — Other Ambulatory Visit: Payer: Self-pay

## 2020-06-22 ENCOUNTER — Ambulatory Visit
Admission: RE | Admit: 2020-06-22 | Discharge: 2020-06-22 | Disposition: A | Discharge status: Home / Self Care | Payer: BC Managed Care – PPO | Source: Ambulatory Visit | Attending: Orthopedic Surgery | Admitting: Orthopedic Surgery

## 2020-06-22 DIAGNOSIS — M25551 Pain in right hip: Secondary | ICD-10-CM

## 2020-06-22 DIAGNOSIS — M25552 Pain in left hip: Secondary | ICD-10-CM
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
## 2020-07-19 ENCOUNTER — Other Ambulatory Visit: Payer: Self-pay | Admitting: Orthopedic Surgery

## 2020-07-19 DIAGNOSIS — M545 Low back pain, unspecified: Secondary | ICD-10-CM

## 2020-09-24 ENCOUNTER — Ambulatory Visit: Payer: BC Managed Care – PPO | Admitting: Cardiovascular Disease

## 2020-09-28 NOTE — Progress Notes (Deleted)
Cardiology Office Note:   Date:  09/28/2020  NAME:  Edward Lozano    MRN: [id number] DOB:  April 25, 1976   PCP:  Hyman Hopes, MD  Cardiologist:  No primary care provider on file.  Electrophysiologist:  None   Referring MD: Hyman Hopes, MD   No chief complaint on file. ***  History of Present Illness:   Edward Lozano is a 45 y.o. male with a hx of HTN who is being seen today for the evaluation of chest pain at the request of Hyman Hopes, MD. Seen in ER 08/30/2020 and ruled out for ACS.   Past Medical History: Past Medical History:  Diagnosis Date  . CHF (congestive heart failure) (HCC)     Past Surgical History: Past Surgical History:  Procedure Laterality Date  . NO PAST SURGERIES      Current Medications: No outpatient medications have been marked as taking for the 10/01/20 encounter (Appointment) with O'Neal, Ronnald Ramp, MD.     Allergies:    Iodine   Social History: Social History   Socioeconomic History  . Marital status: Single    Spouse name: Not on file  . Number of children: Not on file  . Years of education: Not on file  . Highest education level: Not on file  Occupational History  . Not on file  Tobacco Use  . Smoking status: Current Every Day Smoker    Packs/day: 0.50    Types: Cigarettes  . Smokeless tobacco: Never Used  Vaping Use  . Vaping Use: Never used  Substance and Sexual Activity  . Alcohol use: Yes    Comment: occasional  . Drug use: Not Currently    Types: Marijuana  . Sexual activity: Not on file  Other Topics Concern  . Not on file  Social History Narrative  . Not on file   Social Determinants of Health   Financial Resource Strain: Not on file  Food Insecurity: Not on file  Transportation Needs: Not on file  Physical Activity: Not on file  Stress: Not on file  Social Connections: Not on file     Family History: The patient's ***family history includes Healthy in his father; Heart failure in his  mother.  ROS:   All other ROS reviewed and negative. Pertinent positives noted in the HPI.     EKGs/Labs/Other Studies Reviewed:   The following studies were personally reviewed by me today:  EKG:  EKG is *** ordered today.  The ekg ordered today demonstrates ***, and was personally reviewed by me.   Recent Labs: No results found for requested labs within last 8760 hours.   Recent Lipid Panel No results found for: CHOL, TRIG, HDL, CHOLHDL, VLDL, LDLCALC, LDLDIRECT  Physical Exam:   VS:  There were no vitals taken for this visit.   Wt Readings from Last 3 Encounters:  05/09/20 244 lb 14.9 oz (111.1 kg)  11/27/19 245 lb (111.1 kg)  08/05/19 232 lb (105.2 kg)    General: Well nourished, well developed, in no acute distress Head: Atraumatic, normal size  Eyes: PEERLA, EOMI  Neck: Supple, no JVD Endocrine: No thryomegaly Cardiac: Normal S1, S2; RRR; no murmurs, rubs, or gallops Lungs: Clear to auscultation bilaterally, no wheezing, rhonchi or rales  Abd: Soft, nontender, no hepatomegaly  Ext: No edema, pulses 2+ Musculoskeletal: No deformities, BUE and BLE strength normal and equal Skin: Warm and dry, no rashes   Neuro: Alert and oriented to person, place, time, and situation,  CNII-XII grossly intact, no focal deficits  Psych: Normal mood and affect   ASSESSMENT:   Edward Lozano is a 45 y.o. male who presents for the following: No diagnosis found.  PLAN:   There are no diagnoses linked to this encounter.  Disposition: No follow-ups on file.  Medication Adjustments/Labs and Tests Ordered: Current medicines are reviewed at length with the patient today.  Concerns regarding medicines are outlined above.  No orders of the defined types were placed in this encounter.  No orders of the defined types were placed in this encounter.   There are no Patient Instructions on file for this visit.   Time Spent with Patient: I have spent a total of *** minutes with patient reviewing  hospital notes, telemetry, EKGs, labs and examining the patient as well as establishing an assessment and plan that was discussed with the patient.  > 50% of time was spent in direct patient care.  Signed, Lenna Gilford. Flora Lipps, MD [facility]  [address] [place], [place] [address] [phone]  09/28/2020 10:24 AM

## 2020-10-01 ENCOUNTER — Ambulatory Visit: Payer: BC Managed Care – PPO | Admitting: Cardiovascular Disease

## 2020-10-01 ENCOUNTER — Encounter: Payer: Self-pay | Admitting: Cardiovascular Disease

## 2020-10-01 ENCOUNTER — Other Ambulatory Visit: Payer: Self-pay

## 2020-10-01 VITALS — BP 130/88 | HR 52 | Ht 73.0 in | Wt 245.4 lb

## 2020-10-01 DIAGNOSIS — I5022 Chronic systolic (congestive) heart failure: Secondary | ICD-10-CM

## 2020-10-01 DIAGNOSIS — I10 Essential (primary) hypertension: Secondary | ICD-10-CM | POA: Diagnosis not present

## 2020-10-01 DIAGNOSIS — R0789 Other chest pain: Secondary | ICD-10-CM

## 2020-10-01 DIAGNOSIS — R9439 Abnormal result of other cardiovascular function study: Secondary | ICD-10-CM | POA: Diagnosis not present
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
## 2020-10-01 NOTE — Patient Instructions (Signed)
Medication Instructions:  The current medical regimen is effective;  continue present plan and medications.  *If you need a refill on your cardiac medications before your next appointment, please call your pharmacy*   Lab Work: CBC, BMET today  COVID TESTING: February 4th at 10:10 AM- [address], [place]  If you have labs (blood work) drawn today and your tests are completely normal, you will receive your results only by: Marland Kitchen MyChart Message (if you have MyChart) OR . A paper copy in the mail If you have any lab test that is abnormal or we need to change your treatment, we will call you to review the results.   Testing/Procedures: Your physician has requested that you have a cardiac catheterization. Cardiac catheterization is used to diagnose and/or treat various heart conditions. Doctors may recommend this procedure for a number of different reasons. The most common reason is to evaluate chest pain. Chest pain can be a symptom of coronary artery disease (CAD), and cardiac catheterization can show whether plaque is narrowing or blocking your heart's arteries. This procedure is also used to evaluate the valves, as well as measure the blood flow and oxygen levels in different parts of your heart. For further information please visit [email or web address] Please follow instruction sheet, as given.   Follow-Up: At [facility], you and your health needs are our priority.  As part of our continuing mission to provide you with exceptional heart care, we have created designated Provider Care Teams.  These Care Teams include your primary Cardiologist (physician) and Advanced Practice Providers (APPs -  Physician Assistants and Nurse Practitioners) who all work together to provide you with the care you need, when you need it.  We recommend signing up for the patient portal called "MyChart".  Sign up information is provided on this After Visit Summary.  MyChart is used to connect with  patients for Virtual Visits (Telemedicine).  Patients are able to view lab/test results, encounter notes, upcoming appointments, etc.  Non-urgent messages can be sent to your provider as well.   To learn more about what you can do with MyChart, go to [email or web address].    Your next appointment:   As needed  The format for your next appointment:   In Person  Provider:   Lennie Odor, MD   Other Instructions     [facility] GROUP [facility] CARDIOVASCULAR DIVISION [facility] [address] [place] [address] [place] [address] Dept: [phone] Loc: [phone]  Edward Lozano  10/01/2020  You are scheduled for a Cardiac Catheterization on Monday, February 7 with Dr. Nanetta Batty.  1. Please arrive at the [facility] (Main Entrance A) at [facility]: [address] [place], [place] [address] at 9:30 AM (This time is two hours before your procedure to ensure your preparation). Free valet parking service is available.   Special note: Every effort is made to have your procedure done on time. Please understand that emergencies sometimes delay scheduled procedures.  2. Diet: Do not eat solid foods after midnight.  The patient may have clear liquids until 5am upon the day of the procedure.  3. Labs: You will need to have blood drawn today- CBC, BMET. You do not need to be fasting.  4. Medication instructions in preparation for your procedure:   Contrast Allergy: No  On the morning of your procedure, take your Aspirin and any morning medicines NOT listed above.  You may use sips of water.  5. Plan for  one night stay--bring personal belongings. 6. Bring a current list of your medications and current insurance cards. 7. You MUST have a responsible person to drive you home. 8. Someone MUST be with you the first 24 hours after you arrive home or your discharge will be delayed. 9. Please wear clothes that are easy to get on and off and wear slip-on  shoes.  Thank you for allowing [place] to care for you!   -- [employer] [facility]
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
## 2020-10-02 LAB — BASIC METABOLIC PANEL
BUN/Creatinine Ratio: 15 (ref 9–20)
BUN: 15 mg/dL (ref 6–24)
CO2: 23 mmol/L (ref 20–29)
Calcium: 9.3 mg/dL (ref 8.7–10.2)
Chloride: 107 mmol/L — ABNORMAL HIGH (ref 96–106)
Creatinine, Ser: 0.97 mg/dL (ref 0.76–1.27)
GFR calc Af Amer: 109 mL/min/{1.73_m2} (ref >59)
GFR calc non Af Amer: 95 mL/min/{1.73_m2} (ref >59)
Glucose: 98 mg/dL (ref 65–99)
Potassium: 4.6 mmol/L (ref 3.5–5.2)
Sodium: 144 mmol/L (ref 134–144)

## 2020-10-02 LAB — CBC
Hematocrit: 42 % (ref 37.5–51.0)
Hemoglobin: 14.5 g/dL (ref 13.0–17.7)
MCH: 30.3 pg (ref 26.6–33.0)
MCHC: 34.5 g/dL (ref 31.5–35.7)
MCV: 88 fL (ref 79–97)
Platelets: 245 10*3/uL (ref 150–450)
RBC: 4.79 x10E6/uL (ref 4.14–5.80)
RDW: 13.1 % (ref 11.6–15.4)
WBC: 6.6 10*3/uL (ref 3.4–10.8)

## 2020-10-10 ENCOUNTER — Telehealth: Payer: Self-pay | Admitting: *Deleted

## 2020-10-10 NOTE — Addendum Note (Signed)
Addended by: Lindell Spar on: 10/10/2020 09:25 AM   Modules accepted: Orders

## 2020-10-10 NOTE — Telephone Encounter (Signed)
Pt contacted pre-catheterization scheduled at [facility] for: Monday October 14, 2020 11:30 AM Verified arrival time and place: [facility] Main Entrance A [facility]) at: 9:30 AM   No solid food after midnight prior to cath, clear liquids until 5 AM day of procedure.  AM meds can be  taken pre-cath with sips of water including: ASA 81 mg   Confirmed patient has responsible adult to drive home post procedure and be with patient first 24 hours after arriving home: yes  You are allowed ONE visitor in the waiting room during the time you are at the hospital for your procedure. Both you and your visitor must wear a mask once you enter the hospital.   Reviewed procedure/mask/visitor instructions with patient.  Iodine allergy-pt reports he was told when he was a child  iodine used topically caused skin irritation/itching.

## 2020-10-11 ENCOUNTER — Other Ambulatory Visit (HOSPITAL_COMMUNITY)
Admission: RE | Admit: 2020-10-11 | Discharge: 2020-10-11 | Disposition: A | Discharge status: Home / Self Care | Payer: BC Managed Care – PPO | Source: Ambulatory Visit | Attending: Cardiovascular Disease | Admitting: Cardiovascular Disease

## 2020-10-11 DIAGNOSIS — Z20822 Contact with and (suspected) exposure to covid-19: Secondary | ICD-10-CM | POA: Insufficient documentation

## 2020-10-11 DIAGNOSIS — I5022 Chronic systolic (congestive) heart failure: Secondary | ICD-10-CM | POA: Diagnosis not present

## 2020-10-11 DIAGNOSIS — Z01812 Encounter for preprocedural laboratory examination: Secondary | ICD-10-CM | POA: Insufficient documentation

## 2020-10-11 DIAGNOSIS — R9439 Abnormal result of other cardiovascular function study: Secondary | ICD-10-CM | POA: Diagnosis present

## 2020-10-11 DIAGNOSIS — Z79899 Other long term (current) drug therapy: Secondary | ICD-10-CM | POA: Diagnosis not present

## 2020-10-11 DIAGNOSIS — I429 Cardiomyopathy, unspecified: Secondary | ICD-10-CM | POA: Diagnosis not present

## 2020-10-11 DIAGNOSIS — Z87891 Personal history of nicotine dependence: Secondary | ICD-10-CM | POA: Diagnosis not present

## 2020-10-11 DIAGNOSIS — I110 Hypertensive heart disease with heart failure: Secondary | ICD-10-CM | POA: Diagnosis not present

## 2020-10-11 DIAGNOSIS — Z888 Allergy status to other drugs, medicaments and biological substances status: Secondary | ICD-10-CM | POA: Diagnosis not present

## 2020-10-11 LAB — SARS CORONAVIRUS 2 (TAT 6-24 HRS): SARS Coronavirus 2: NEGATIVE

## 2020-10-14 ENCOUNTER — Ambulatory Visit (HOSPITAL_COMMUNITY)
Admission: RE | Admit: 2020-10-14 | Discharge: 2020-10-14 | Disposition: A | Discharge status: Home / Self Care | Payer: BC Managed Care – PPO | Attending: Cardiovascular Disease | Admitting: Cardiovascular Disease

## 2020-10-14 ENCOUNTER — Encounter (HOSPITAL_COMMUNITY)
Admission: RE | Disposition: A | Discharge status: Home / Self Care | Payer: Self-pay | Source: Home / Self Care | Attending: Cardiovascular Disease

## 2020-10-14 ENCOUNTER — Other Ambulatory Visit: Payer: Self-pay

## 2020-10-14 DIAGNOSIS — Z20822 Contact with and (suspected) exposure to covid-19: Secondary | ICD-10-CM | POA: Insufficient documentation

## 2020-10-14 DIAGNOSIS — I5043 Acute on chronic combined systolic (congestive) and diastolic (congestive) heart failure: Secondary | ICD-10-CM

## 2020-10-14 DIAGNOSIS — Z888 Allergy status to other drugs, medicaments and biological substances status: Secondary | ICD-10-CM | POA: Insufficient documentation

## 2020-10-14 DIAGNOSIS — I429 Cardiomyopathy, unspecified: Secondary | ICD-10-CM | POA: Insufficient documentation

## 2020-10-14 DIAGNOSIS — I5022 Chronic systolic (congestive) heart failure: Secondary | ICD-10-CM | POA: Insufficient documentation

## 2020-10-14 DIAGNOSIS — I110 Hypertensive heart disease with heart failure: Secondary | ICD-10-CM | POA: Insufficient documentation

## 2020-10-14 DIAGNOSIS — Z79899 Other long term (current) drug therapy: Secondary | ICD-10-CM | POA: Insufficient documentation

## 2020-10-14 DIAGNOSIS — R9439 Abnormal result of other cardiovascular function study: Secondary | ICD-10-CM | POA: Insufficient documentation

## 2020-10-14 DIAGNOSIS — Z87891 Personal history of nicotine dependence: Secondary | ICD-10-CM | POA: Insufficient documentation

## 2020-10-14 HISTORY — PX: RIGHT/LEFT HEART CATH AND CORONARY ANGIOGRAPHY: CATH118266

## 2020-10-14 LAB — POCT I-STAT EG7
Acid-Base Excess: 0 mmol/L (ref 0.0–2.0)
Acid-base deficit: 1 mmol/L (ref 0.0–2.0)
Bicarbonate: 25.4 mmol/L (ref 20.0–28.0)
Bicarbonate: 26.3 mmol/L (ref 20.0–28.0)
Calcium, Ion: 1.27 mmol/L (ref 1.15–1.40)
Calcium, Ion: 1.31 mmol/L (ref 1.15–1.40)
HCT: 43 % (ref 39.0–52.0)
HCT: 44 % (ref 39.0–52.0)
Hemoglobin: 14.6 g/dL (ref 13.0–17.0)
Hemoglobin: 15 g/dL (ref 13.0–17.0)
O2 Saturation: 78 %
O2 Saturation: 80 %
Potassium: 4.2 mmol/L (ref 3.5–5.1)
Potassium: 4.2 mmol/L (ref 3.5–5.1)
Sodium: 142 mmol/L (ref 135–145)
Sodium: 142 mmol/L (ref 135–145)
TCO2: 27 mmol/L (ref 22–32)
TCO2: 28 mmol/L (ref 22–32)
pCO2, Ven: 47 mmHg (ref 44.0–60.0)
pCO2, Ven: 47.5 mmHg (ref 44.0–60.0)
pH, Ven: 7.341 (ref 7.250–7.430)
pH, Ven: 7.351 (ref 7.250–7.430)
pO2, Ven: 46 mmHg — ABNORMAL HIGH (ref 32.0–45.0)
pO2, Ven: 47 mmHg — ABNORMAL HIGH (ref 32.0–45.0)

## 2020-10-14 LAB — POCT I-STAT 7, (LYTES, BLD GAS, ICA,H+H)
Acid-Base Excess: 0 mmol/L (ref 0.0–2.0)
Bicarbonate: 25.4 mmol/L (ref 20.0–28.0)
Calcium, Ion: 1.28 mmol/L (ref 1.15–1.40)
HCT: 44 % (ref 39.0–52.0)
Hemoglobin: 15 g/dL (ref 13.0–17.0)
O2 Saturation: 99 %
Potassium: 4.2 mmol/L (ref 3.5–5.1)
Sodium: 143 mmol/L (ref 135–145)
TCO2: 27 mmol/L (ref 22–32)
pCO2 arterial: 44.1 mmHg (ref 32.0–48.0)
pH, Arterial: 7.368 (ref 7.350–7.450)
pO2, Arterial: 144 mmHg — ABNORMAL HIGH (ref 83.0–108.0)

## 2020-10-14 SURGERY — RIGHT/LEFT HEART CATH AND CORONARY ANGIOGRAPHY
Anesthesia: LOCAL

## 2020-10-14 MED ORDER — HEPARIN SODIUM (PORCINE) 1000 UNIT/ML IJ SOLN
INTRAMUSCULAR | Status: DC | PRN
  Administered 2020-10-14: 5000 [IU] via INTRAVENOUS

## 2020-10-14 MED ORDER — HYDRALAZINE HCL 20 MG/ML IJ SOLN: 10.0000 mg | INTRAMUSCULAR | Status: DC | PRN

## 2020-10-14 MED ORDER — SODIUM CHLORIDE 0.9 % IV SOLN
INTRAVENOUS | Status: DC
  Administered 2020-10-14: 10 mL via INTRAVENOUS

## 2020-10-14 MED ORDER — DIPHENHYDRAMINE HCL 50 MG/ML IJ SOLN
25.0000 mg | Freq: Once | INTRAMUSCULAR | Status: AC
  Administered 2020-10-14: 25 mg via INTRAVENOUS
  Filled 2020-10-14: qty 1

## 2020-10-14 MED ORDER — SODIUM CHLORIDE 0.9% FLUSH: 3.0000 mL | INTRAVENOUS | Status: DC | PRN

## 2020-10-14 MED ORDER — NITROGLYCERIN 1 MG/10 ML FOR IR/CATH LAB
INTRA_ARTERIAL | Status: AC
  Filled 2020-10-14: qty 10

## 2020-10-14 MED ORDER — HEPARIN SODIUM (PORCINE) 1000 UNIT/ML IJ SOLN
INTRAMUSCULAR | Status: AC
  Filled 2020-10-14: qty 1

## 2020-10-14 MED ORDER — HEPARIN (PORCINE) IN NACL 1000-0.9 UT/500ML-% IV SOLN
INTRAVENOUS | Status: AC
  Filled 2020-10-14: qty 1000

## 2020-10-14 MED ORDER — SODIUM CHLORIDE 0.9 % IV SOLN: INTRAVENOUS | Status: DC

## 2020-10-14 MED ORDER — ONDANSETRON HCL 4 MG/2ML IJ SOLN: 4.0000 mg | Freq: Four times a day (QID) | INTRAMUSCULAR | Status: DC | PRN

## 2020-10-14 MED ORDER — SODIUM CHLORIDE 0.9 % IV SOLN: 250.0000 mL | INTRAVENOUS | Status: DC | PRN

## 2020-10-14 MED ORDER — SODIUM CHLORIDE 0.9% FLUSH: 3.0000 mL | Freq: Two times a day (BID) | INTRAVENOUS | Status: DC

## 2020-10-14 MED ORDER — HEPARIN (PORCINE) IN NACL 1000-0.9 UT/500ML-% IV SOLN
INTRAVENOUS | Status: DC | PRN
  Administered 2020-10-14 (×2): 500 mL

## 2020-10-14 MED ORDER — METHYLPREDNISOLONE SODIUM SUCC 125 MG IJ SOLR
125.0000 mg | Freq: Once | INTRAMUSCULAR | Status: AC
  Administered 2020-10-14: 125 mg via INTRAVENOUS
  Filled 2020-10-14: qty 2

## 2020-10-14 MED ORDER — ASPIRIN 81 MG PO CHEW: 81.0000 mg | CHEWABLE_TABLET | ORAL | Status: DC

## 2020-10-14 MED ORDER — LIDOCAINE HCL (PF) 1 % IJ SOLN
INTRAMUSCULAR | Status: DC | PRN
  Administered 2020-10-14 (×2): 2 mL

## 2020-10-14 MED ORDER — VERAPAMIL HCL 2.5 MG/ML IV SOLN
INTRAVENOUS | Status: AC
  Filled 2020-10-14: qty 2

## 2020-10-14 MED ORDER — MORPHINE SULFATE (PF) 2 MG/ML IV SOLN: 2.0000 mg | INTRAVENOUS | Status: DC | PRN

## 2020-10-14 MED ORDER — ACETAMINOPHEN 325 MG PO TABS: 650.0000 mg | ORAL_TABLET | ORAL | Status: DC | PRN

## 2020-10-14 MED ORDER — VERAPAMIL HCL 2.5 MG/ML IV SOLN
INTRA_ARTERIAL | Status: DC | PRN
  Administered 2020-10-14: 7 mL via INTRA_ARTERIAL

## 2020-10-14 MED ORDER — LABETALOL HCL 5 MG/ML IV SOLN: 10.0000 mg | INTRAVENOUS | Status: DC | PRN

## 2020-10-14 MED ORDER — LIDOCAINE HCL (PF) 1 % IJ SOLN
INTRAMUSCULAR | Status: AC
  Filled 2020-10-14: qty 30

## 2020-10-14 MED ORDER — IOHEXOL 350 MG/ML SOLN
INTRAVENOUS | Status: DC | PRN
  Administered 2020-10-14: 35 mL

## 2020-10-14 SURGICAL SUPPLY — 16 items
BAG SNAP BAND KOVER 36X36 (MISCELLANEOUS) ×2 IMPLANT
CATH BALLN WEDGE 5F 110CM (CATHETERS) ×2 IMPLANT
CATH OPTITORQUE TIG 4.0 5F (CATHETERS) ×2 IMPLANT
COVER DOME SNAP 22 D (MISCELLANEOUS) ×2 IMPLANT
DEVICE RAD COMP TR BAND LRG (VASCULAR PRODUCTS) ×2 IMPLANT
DEVICE RAD TR BAND REGULAR (VASCULAR PRODUCTS) IMPLANT
GLIDESHEATH SLEND A-KIT 6F 22G (SHEATH) ×2 IMPLANT
GUIDEWIRE .025 260CM (WIRE) ×2 IMPLANT
GUIDEWIRE INQWIRE 1.5J.035X260 (WIRE) ×1 IMPLANT
INQWIRE 1.5J .035X260CM (WIRE) ×2
KIT HEART LEFT (KITS) ×2 IMPLANT
PACK CARDIAC CATHETERIZATION (CUSTOM PROCEDURE TRAY) ×2 IMPLANT
SHEATH GLIDE SLENDER 4/5FR (SHEATH) ×2 IMPLANT
TRANSDUCER W/STOPCOCK (MISCELLANEOUS) ×2 IMPLANT
TUBING CIL FLEX 10 FLL-RA (TUBING) ×2 IMPLANT
WIRE HI TORQ VERSACORE-J 145CM (WIRE) ×2 IMPLANT

## 2020-10-14 NOTE — Interval H&P Note (Signed)
Cath Lab Visit (complete for each Cath Lab visit)  Clinical Evaluation Leading to the Procedure:   ACS: No.  Non-ACS:    Anginal Classification: CCS II  Anti-ischemic medical therapy: Minimal Therapy (1 class of medications)  Non-Invasive Test Results: Low-risk stress test findings: cardiac mortality <1%/year  Prior CABG: No previous CABG      History and Physical Interval Note:  10/14/2020 1:05 PM  Edward Lozano  has presented today for surgery, with the diagnosis of abnormal stress test.  The various methods of treatment have been discussed with the patient and family. After consideration of risks, benefits and other options for treatment, the patient has consented to  Procedure(s): RIGHT/LEFT HEART CATH AND CORONARY ANGIOGRAPHY (N/A) as a surgical intervention.  The patient's history has been reviewed, patient examined, no change in status, stable for surgery.  I have reviewed the patient's chart and labs.  Questions were answered to the patient's satisfaction.     Nanetta Batty

## 2020-10-14 NOTE — Discharge Instructions (Signed)
Radial Site Care ELEVATE RIGHT WRIST 24 HOURS   This sheet gives you information about how to care for yourself after your procedure. Your health care provider may also give you more specific instructions. If you have problems or questions, contact your health care provider. What can I expect after the procedure? After the procedure, it is common to have:  Bruising and tenderness at the catheter insertion area. Follow these instructions at home: Medicines  Take over-the-counter and prescription medicines only as told by your health care provider. Insertion site care  Follow instructions from your health care provider about how to take care of your insertion site. Make sure you: ? Wash your hands with soap and water before you change your bandage (dressing). If soap and water are not available, use hand sanitizer. ? Change your dressing as told by your health care provider. ? Leave stitches (sutures), skin glue, or adhesive strips in place. These skin closures may need to stay in place for 2 weeks or longer. If adhesive strip edges start to loosen and curl up, you may trim the loose edges. Do not remove adhesive strips completely unless your health care provider tells you to do that.  Check your insertion site every day for signs of infection. Check for: ? Redness, swelling, or pain. ? Fluid or blood. ? Pus or a bad smell. ? Warmth.  Do not take baths, swim, or use a hot tub until your health care provider approves.  You may shower 24-48 hours after the procedure, or as directed by your health care provider. ? Remove the dressing and gently wash the site with plain soap and water. ? Pat the area dry with a clean towel. ? Do not rub the site. That could cause bleeding.  Do not apply powder or lotion to the site. Activity  For 24 hours after the procedure, or as directed by your health care provider: ? Do not flex or bend the affected arm. ? Do not push or pull heavy objects with  the affected arm. ? Do not drive yourself home from the hospital or clinic. You may drive 24 hours after the procedure unless your health care provider tells you not to. ? Do not operate machinery or power tools.  Do not lift anything that is heavier than 10 lb (4.5 kg), or the limit that you are told, until your health care provider says that it is safe.  Ask your health care provider when it is okay to: ? Return to work or school. ? Resume usual physical activities or sports. ? Resume sexual activity.   General instructions  If the catheter site starts to bleed, raise your arm and put firm pressure on the site. If the bleeding does not stop, get help right away. This is a medical emergency.  If you went home on the same day as your procedure, a responsible adult should be with you for the first 24 hours after you arrive home.  Keep all follow-up visits as told by your health care provider. This is important. Contact a health care provider if:  You have a fever.  You have redness, swelling, or yellow drainage around your insertion site. Get help right away if:  You have unusual pain at the radial site.  The catheter insertion area swells very fast.  The insertion area is bleeding, and the bleeding does not stop when you hold steady pressure on the area.  Your arm or hand becomes pale, cool, tingly, or numb.   These symptoms may represent a serious problem that is an emergency. Do not wait to see if the symptoms will go away. Get medical help right away. Call your local emergency services (911 in the [place] Do not drive yourself to the hospital. Summary  After the procedure, it is common to have bruising and tenderness at the site.  Follow instructions from your health care provider about how to take care of your radial site wound. Check the wound every day for signs of infection.  Do not lift anything that is heavier than 10 lb (4.5 kg), or the limit that you are told, until your  health care provider says that it is safe. This information is not intended to replace advice given to you by your health care provider. Make sure you discuss any questions you have with your health care provider. Document Revised: 09/29/2017 Document Reviewed: 09/29/2017 Elsevier Patient Education  2021 [employer]  

## 2020-10-15 ENCOUNTER — Encounter (HOSPITAL_COMMUNITY): Payer: Self-pay | Admitting: Cardiovascular Disease

## 2020-10-22 ENCOUNTER — Other Ambulatory Visit: Payer: Self-pay

## 2020-10-22 ENCOUNTER — Ambulatory Visit
Admission: RE | Admit: 2020-10-22 | Discharge: 2020-10-22 | Disposition: A | Discharge status: Home / Self Care | Payer: BC Managed Care – PPO | Source: Ambulatory Visit | Attending: Orthopedic Surgery | Admitting: Orthopedic Surgery

## 2020-10-22 DIAGNOSIS — M545 Low back pain, unspecified: Secondary | ICD-10-CM
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
